# Patient Record
Sex: Male | Born: 1935 | Race: White | Hispanic: No | Marital: Married | Smoking: Former smoker
Health system: Southern US, Community
[De-identification: ages and names within clinical notes are randomized; demographics above are authoritative.]

## PROBLEM LIST (undated history)

## (undated) DIAGNOSIS — Z8673 Personal history of transient ischemic attack (TIA), and cerebral infarction without residual deficits: Secondary | ICD-10-CM

## (undated) DIAGNOSIS — E079 Disorder of thyroid, unspecified: Secondary | ICD-10-CM

## (undated) DIAGNOSIS — I10 Essential (primary) hypertension: Secondary | ICD-10-CM

## (undated) DIAGNOSIS — C44301 Unspecified malignant neoplasm of skin of nose: Secondary | ICD-10-CM

## (undated) DIAGNOSIS — K219 Gastro-esophageal reflux disease without esophagitis: Secondary | ICD-10-CM

## (undated) DIAGNOSIS — F32A Depression, unspecified: Secondary | ICD-10-CM

## (undated) DIAGNOSIS — I639 Cerebral infarction, unspecified: Secondary | ICD-10-CM

## (undated) DIAGNOSIS — K449 Diaphragmatic hernia without obstruction or gangrene: Secondary | ICD-10-CM

## (undated) DIAGNOSIS — N400 Enlarged prostate without lower urinary tract symptoms: Secondary | ICD-10-CM

## (undated) DIAGNOSIS — E785 Hyperlipidemia, unspecified: Secondary | ICD-10-CM

## (undated) DIAGNOSIS — F419 Anxiety disorder, unspecified: Secondary | ICD-10-CM

## (undated) DIAGNOSIS — F329 Major depressive disorder, single episode, unspecified: Secondary | ICD-10-CM

## (undated) DIAGNOSIS — Z9289 Personal history of other medical treatment: Secondary | ICD-10-CM

## (undated) HISTORY — DX: Disorder of thyroid, unspecified: E07.9

## (undated) HISTORY — DX: Essential (primary) hypertension: I10

## (undated) HISTORY — DX: Diaphragmatic hernia without obstruction or gangrene: K44.9

## (undated) HISTORY — DX: Benign prostatic hyperplasia without lower urinary tract symptoms: N40.0

## (undated) HISTORY — DX: Gastro-esophageal reflux disease without esophagitis: K21.9

## (undated) HISTORY — DX: Cerebral infarction, unspecified: I63.9

## (undated) HISTORY — DX: Depression, unspecified: F32.A

## (undated) HISTORY — DX: Major depressive disorder, single episode, unspecified: F32.9

## (undated) HISTORY — DX: Hyperlipidemia, unspecified: E78.5

## (undated) HISTORY — DX: Anxiety disorder, unspecified: F41.9

## (undated) HISTORY — PX: NO PAST SURGERIES: SHX2092

## (undated) HISTORY — PX: COLONOSCOPY: SHX174

## (undated) HISTORY — PX: NOSE SURGERY: SHX723

---

## 1998-12-24 DIAGNOSIS — Z9289 Personal history of other medical treatment: Secondary | ICD-10-CM

## 1998-12-24 HISTORY — DX: Personal history of other medical treatment: Z92.89

## 2002-12-30 ENCOUNTER — Ambulatory Visit (HOSPITAL_COMMUNITY): Admission: RE | Admit: 2002-12-30 | Discharge: 2002-12-30 | Payer: Self-pay | Admitting: *Deleted

## 2003-05-12 ENCOUNTER — Ambulatory Visit (HOSPITAL_COMMUNITY): Admission: RE | Admit: 2003-05-12 | Discharge: 2003-05-12 | Payer: Self-pay | Admitting: *Deleted

## 2003-06-10 ENCOUNTER — Ambulatory Visit (HOSPITAL_COMMUNITY): Admission: RE | Admit: 2003-06-10 | Discharge: 2003-06-10 | Payer: Self-pay | Admitting: *Deleted

## 2003-12-25 DIAGNOSIS — Z8673 Personal history of transient ischemic attack (TIA), and cerebral infarction without residual deficits: Secondary | ICD-10-CM

## 2003-12-25 HISTORY — DX: Personal history of transient ischemic attack (TIA), and cerebral infarction without residual deficits: Z86.73

## 2006-02-15 ENCOUNTER — Emergency Department (HOSPITAL_COMMUNITY): Admission: EM | Admit: 2006-02-15 | Discharge: 2006-02-16 | Payer: Self-pay | Admitting: Emergency Medicine

## 2007-03-06 IMAGING — CR DG CHEST 2V
2 series · 2 of 2 positions shown · non-contrast
Comparison: none

CLINICAL DATA: Syncope.  Vomiting.
 KSSYT-S VIEWS:
 Two views of the chest show the lungs to be clear.  The heart is within normal limits and size.  No bony abnormality is seen.

[w chest pa]
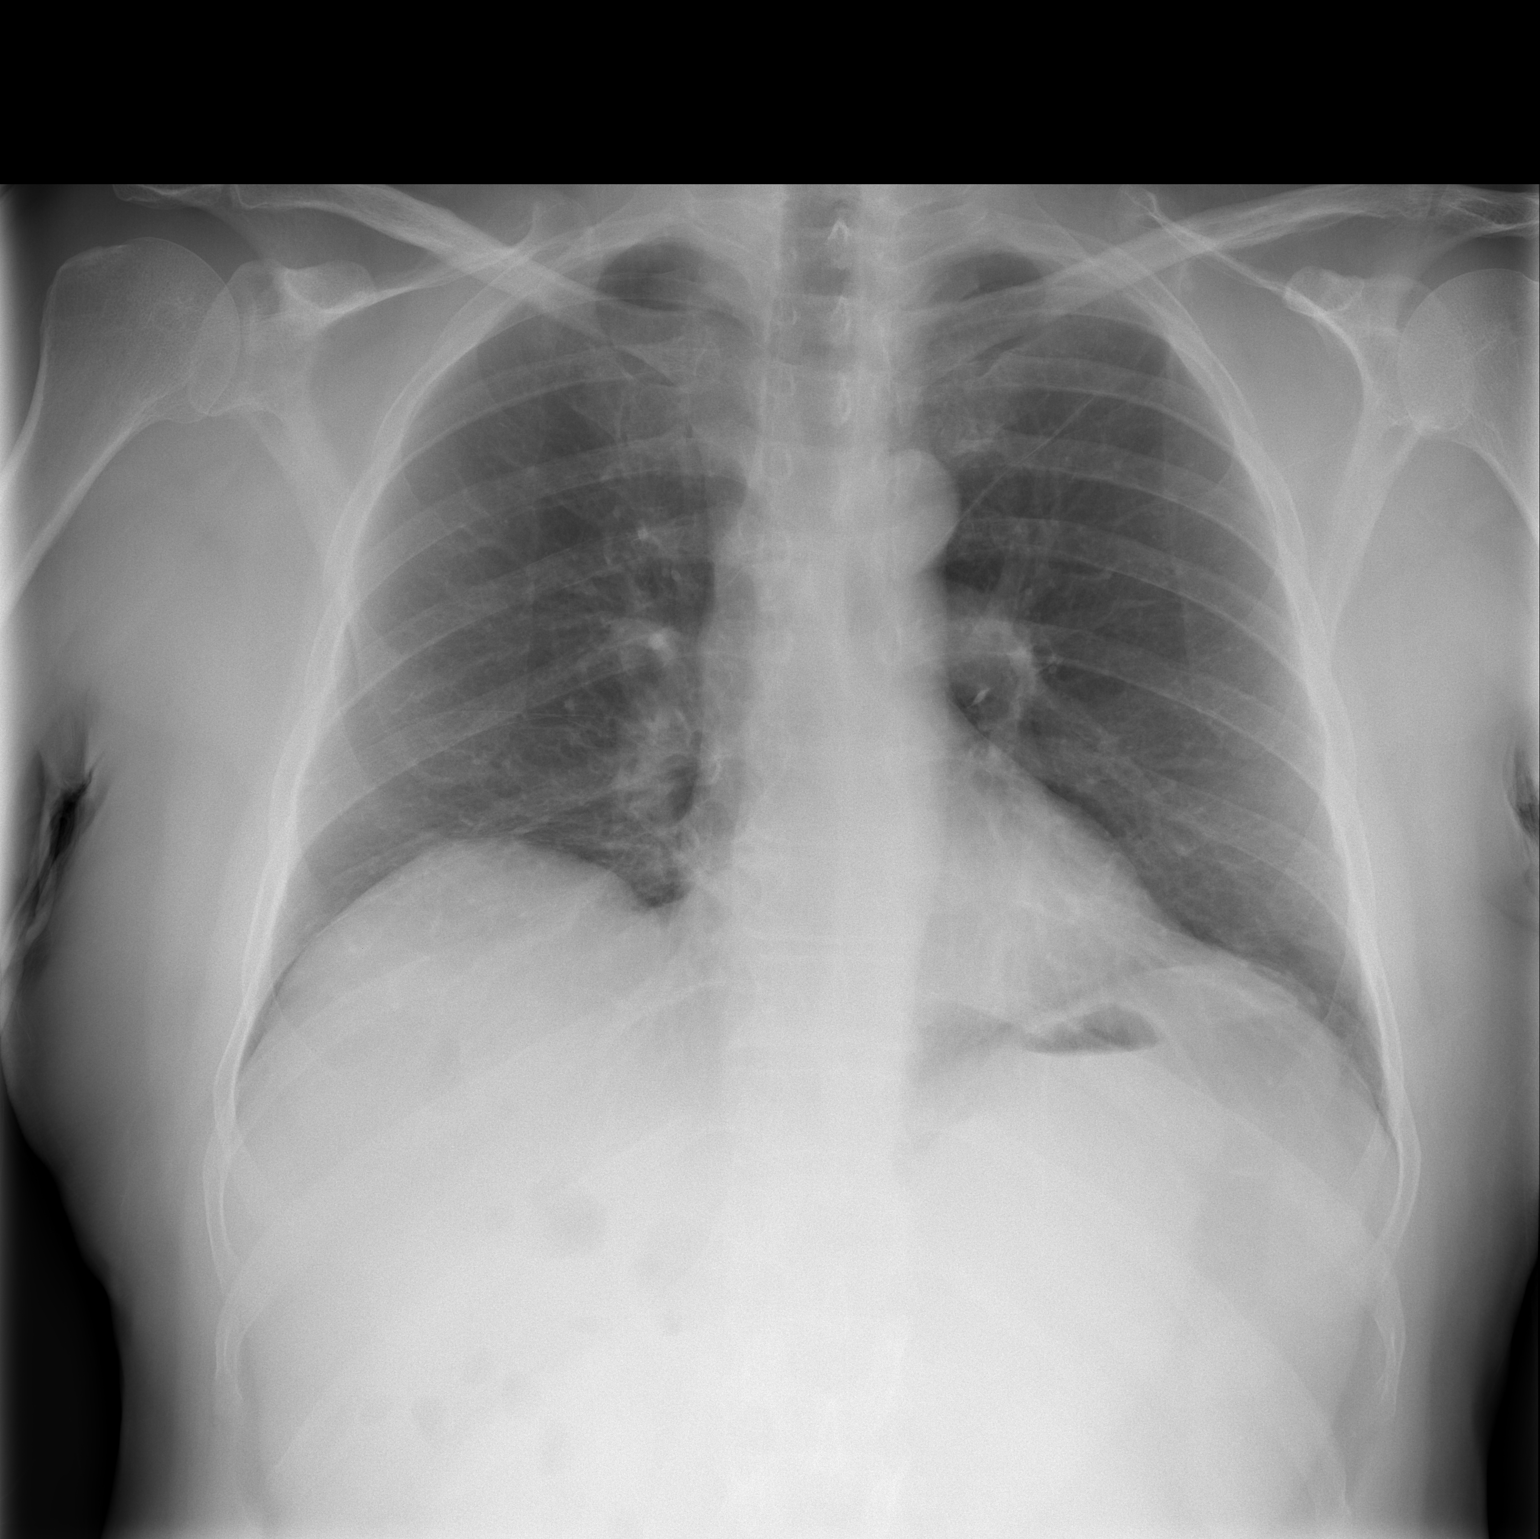

[w chest lat]
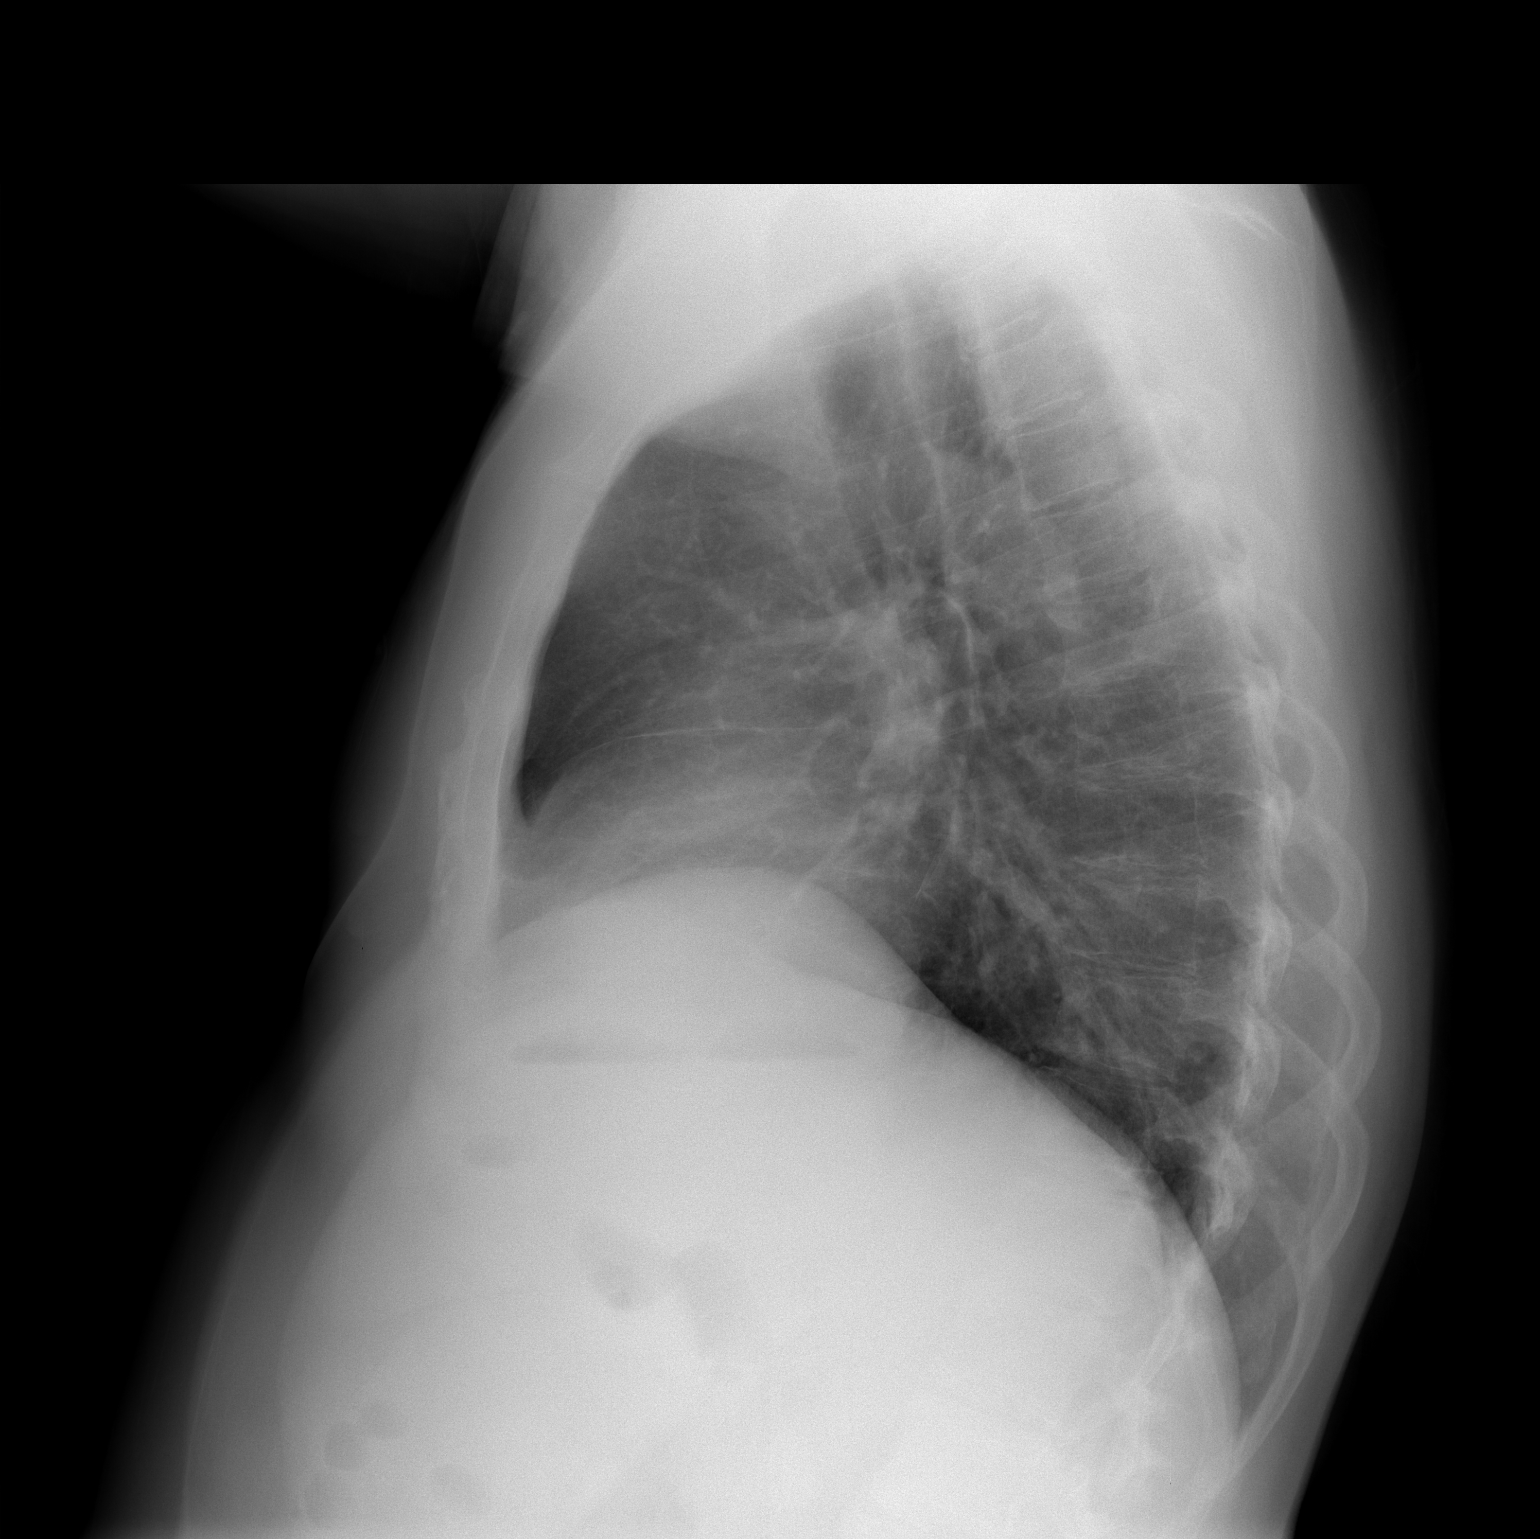

[2 of 2 positions shown; findings below may reference images not displayed]

IMPRESSION: No active lung disease.

## 2011-01-09 ENCOUNTER — Ambulatory Visit (HOSPITAL_COMMUNITY): Admission: RE | Admit: 2011-01-09 | Payer: Self-pay | Source: Home / Self Care | Admitting: General Surgery

## 2014-03-01 ENCOUNTER — Ambulatory Visit (INDEPENDENT_AMBULATORY_CARE_PROVIDER_SITE_OTHER): Payer: Medicare Other | Admitting: General Surgery

## 2014-03-01 ENCOUNTER — Encounter (INDEPENDENT_AMBULATORY_CARE_PROVIDER_SITE_OTHER): Payer: Self-pay | Admitting: General Surgery

## 2014-03-01 VITALS — BP 122/78 | HR 64 | Temp 97.7°F | Resp 18 | Ht 66.0 in | Wt 177.6 lb

## 2014-03-01 DIAGNOSIS — K4090 Unilateral inguinal hernia, without obstruction or gangrene, not specified as recurrent: Secondary | ICD-10-CM | POA: Insufficient documentation

## 2014-03-01 NOTE — Progress Notes (Signed)
Patient ID: Andrew Burch, male   DOB: [birth year], 80 y.o.   MRN: [id number]  Chief Complaint  Patient presents with  . Hernia    HPI Andrew Burch is a 80 y.o. male.  He returns to see me for reevaluation and surgical management of his left inguinal hernia.  I evaluated this gentleman in September of 2011. He had a left inguinal hernia but really wasn't hurting him. He plays golf 4 times a week. He wanted to go ahead and have this repaired but then decided against it. The hernia has gotten much bigger but now it hurts him more. His health is otherwise been stable.  Comorbidities include past history of TIA, hypertension, hyperlipidemia, GERD.  He takes Plavix and aspirin daily. antihypertensives.  He is allergic to penicillin which caused him to have a syncopal episode as a child.  HPI  Past Medical History  Diagnosis Date  . GERD (gastroesophageal reflux disease)   . Hyperlipidemia   . Hypertension   . Stroke   . Thyroid disease     History reviewed. No pertinent past surgical history.  History reviewed. No pertinent family history.  Social History History  Substance Use Topics  . Smoking status: Former Research scientist (life sciences)  . Smokeless tobacco: Not on file  . Alcohol Use: No    Allergies  Allergen Reactions  . Penicillins     Current Outpatient Prescriptions  Medication Sig Dispense Refill  . beta carotene w/minerals (OCUVITE) tablet Take 1 tablet by mouth daily.      . clopidogrel (PLAVIX) 75 MG tablet       . fenofibrate 160 MG tablet       . levothyroxine (SYNTHROID, LEVOTHROID) 88 MCG tablet       . Omega-3 Fatty Acids (FISH OIL) 1000 MG CAPS Take by mouth.      Marland Kitchen PARoxetine (PAXIL) 20 MG tablet       . quinapril (ACCUPRIL) 20 MG tablet       . rosuvastatin (CRESTOR) 20 MG tablet Take 20 mg by mouth daily.       No current facility-administered medications for this visit.    Review of Systems Review of Systems  Constitutional: Negative for fever, chills and unexpected  weight change.  HENT: Negative for congestion, hearing loss, sore throat, trouble swallowing and voice change.   Eyes: Negative for visual disturbance.  Respiratory: Negative for cough and wheezing.   Cardiovascular: Negative for chest pain, palpitations and leg swelling.  Gastrointestinal: Negative for nausea, vomiting, abdominal pain, diarrhea, constipation, blood in stool, abdominal distention, anal bleeding and rectal pain.  Genitourinary: Negative for hematuria and difficulty urinating.  Musculoskeletal: Negative for arthralgias.  Skin: Negative for rash and wound.  Neurological: Negative for seizures, syncope, weakness and headaches.  Hematological: Negative for adenopathy. Does not bruise/bleed easily.  Psychiatric/Behavioral: Negative for confusion.    Blood pressure 122/78, pulse 64, temperature 97.7 F (36.5 C), temperature source Temporal, resp. rate 18, height 5\' 6"  (1.676 m), weight 177 lb 9.6 oz (80.559 kg).  Physical Exam Physical Exam  Constitutional: He is oriented to person, place, and time. He appears well-developed and well-nourished. No distress.  Very pleasant   HENT:  Head: Normocephalic.  Nose: Nose normal.  Mouth/Throat: No oropharyngeal exudate.  Eyes: Conjunctivae and EOM are normal. Pupils are equal, round, and reactive to light. Right eye exhibits no discharge. Left eye exhibits no discharge. No scleral icterus.  Neck: Normal range of motion. Neck supple. No JVD present. No tracheal deviation present.  No thyromegaly present.  Cardiovascular: Normal rate, regular rhythm, normal heart sounds and intact distal pulses.   No murmur heard. Pulmonary/Chest: Effort normal and breath sounds normal. No stridor. No respiratory distress. He has no wheezes. He has no rales. He exhibits no tenderness.  Abdominal: Soft. Bowel sounds are normal. He exhibits no distension and no mass. There is no tenderness. There is no rebound and no guarding.  Genitourinary:  Small to  medium size left inguinal hernia. Reducible. No hernia on the right. No scrotal or testicular mass.right testicle rides higher than left.  Musculoskeletal: Normal range of motion. He exhibits no edema and no tenderness.  Lymphadenopathy:    He has no cervical adenopathy.  Neurological: He is alert and oriented to person, place, and time. He has normal reflexes. Coordination normal.  Skin: Skin is warm and dry. No rash noted. He is not diaphoretic. No erythema. No pallor.  Psychiatric: He has a normal mood and affect. His behavior is normal. Judgment and thought content normal.    Data Reviewed My old records  Assessment    Left inguinal hernia, becoming symptomatic and painful  History of transient ischemic attack, on ASA and Plavix  Hypertension  Hyperlipidemia  Hypothyroidism     Plan    We had long conversation about the anatomy and natural history of his inguinal hernia. We talked about open repair and laparoscopic repair, and the pros and cons of each.  We will proceed with scheduling for laparoscopic repair of left inguinal hernia with mesh, possible open.  Discontinue Plavix 5 days preop and aspirin 3 days preop, if approved by his PCP, [name].  I discussed the indications, details techniques, and numerous risk of the surgery with him. He is aware of the risk of bleeding, infection, recurrence, nerve damage, chronic pain, injury to adjacent organs as the testicle or bladder, cardiac, pulmonary, and thromboembolic problems. He is aware of the risk of conversion to open surgery. He understands all these issues. All his questions are answered. He agrees with this plan.        Edsel Petrin. Dalbert Batman, M.D., [facility] General and Minimally invasive Surgery Breast and Colorectal Surgery Office:   [phone] Pager:   [phone]  03/01/2014, 4:22 PM

## 2014-03-01 NOTE — Patient Instructions (Signed)
You will be scheduled for a laparoscopic repair of your left inguinal hernia with mesh, possible open repair.  I would like you to stop your Plavix 5 days preop and stop your aspirin 3 days preop. We will ask Dr. Serina Cowper if he improves of this, just like we did last time.      Inguinal Hernia, Adult Muscles help keep everything in the body in its proper place. But if a weak spot in the muscles develops, something can poke through. That is called a hernia. When this happens in the lower part of the belly (abdomen), it is called an inguinal hernia. (It takes its name from a part of the body in this region called the inguinal canal.) A weak spot in the wall of muscles lets some fat or part of the small intestine bulge through. An inguinal hernia can develop at any age. Men get them more often than women. CAUSES  In adults, an inguinal hernia develops over time.  It can be triggered by:  Suddenly straining the muscles of the lower abdomen.  Lifting heavy objects.  Straining to have a bowel movement. Difficult bowel movements (constipation) can lead to this.  Constant coughing. This may be caused by smoking or lung disease.  Being overweight.  Being pregnant.  Working at a job that requires long periods of standing or heavy lifting.  Having had an inguinal hernia before. One type can be an emergency situation. It is called a strangulated inguinal hernia. It develops if part of the small intestine slips through the weak spot and cannot get back into the abdomen. The blood supply can be cut off. If that happens, part of the intestine may die. This situation requires emergency surgery. SYMPTOMS  Often, a small inguinal hernia has no symptoms. It is found when a healthcare provider does a physical exam. Larger hernias usually have symptoms.   In adults, symptoms may include:  A lump in the groin. This is easier to see when the person is standing. It might disappear when lying  down.  In men, a lump in the scrotum.  Pain or burning in the groin. This occurs especially when lifting, straining or coughing.  A dull ache or feeling of pressure in the groin.  Signs of a strangulated hernia can include:  A bulge in the groin that becomes very painful and tender to the touch.  A bulge that turns red or purple.  Fever, nausea and vomiting.  Inability to have a bowel movement or to pass gas. DIAGNOSIS  To decide if you have an inguinal hernia, a healthcare provider will probably do a physical examination.  This will include asking questions about any symptoms you have noticed.  The healthcare provider might feel the groin area and ask you to cough. If an inguinal hernia is felt, the healthcare provider may try to slide it back into the abdomen.  Usually no other tests are needed. TREATMENT  Treatments can vary. The size of the hernia makes a difference. Options include:  Watchful waiting. This is often suggested if the hernia is small and you have had no symptoms.  No medical procedure will be done unless symptoms develop.  You will need to watch closely for symptoms. If any occur, contact your healthcare provider right away.  Surgery. This is used if the hernia is larger or you have symptoms.  Open surgery. This is usually an outpatient procedure (you will not stay overnight in a hospital). An cut (incision)  is made through the skin in the groin. The hernia is put back inside the abdomen. The weak area in the muscles is then repaired by herniorrhaphy or hernioplasty. Herniorrhaphy: in this type of surgery, the weak muscles are sewn back together. Hernioplasty: a patch or mesh is used to close the weak area in the abdominal wall.  Laparoscopy. In this procedure, a surgeon makes small incisions. A thin tube with a tiny video camera (called a laparoscope) is put into the abdomen. The surgeon repairs the hernia with mesh by looking with the video camera and using  two long instruments. HOME CARE INSTRUCTIONS   After surgery to repair an inguinal hernia:  You will need to take pain medicine prescribed by your healthcare provider. Follow all directions carefully.  You will need to take care of the wound from the incision.  Your activity will be restricted for awhile. This will probably include no heavy lifting for several weeks. You also should not do anything too active for a few weeks. When you can return to work will depend on the type of job that you have.  During "watchful waiting" periods, you should:  Maintain a healthy weight.  Eat a diet high in fiber (fruits, vegetables and whole grains).  Drink plenty of fluids to avoid constipation. This means drinking enough water and other liquids to keep your urine clear or pale yellow.  Do not lift heavy objects.  Do not stand for long periods of time.  Quit smoking. This should keep you from developing a frequent cough. SEEK MEDICAL CARE IF:   A bulge develops in your groin area.  You feel pain, a burning sensation or pressure in the groin. This might be worse if you are lifting or straining.  You develop a fever of more than 100.5 F (38.1 C). SEEK IMMEDIATE MEDICAL CARE IF:   Pain in the groin increases suddenly.  A bulge in the groin gets bigger suddenly and does not go down.  For men, there is sudden pain in the scrotum. Or, the size of the scrotum increases.  A bulge in the groin area becomes red or purple and is painful to touch.  You have nausea or vomiting that does not go away.  You feel your heart beating much faster than normal.  You cannot have a bowel movement or pass gas.  You develop a fever of more than 102.0 F (38.9 C). Document Released: 04/28/2009 Document Revised: 03/03/2012 Document Reviewed: 04/28/2009 [facility] Patient Information 2014 [place], [place].

## 2014-03-22 ENCOUNTER — Telehealth (INDEPENDENT_AMBULATORY_CARE_PROVIDER_SITE_OTHER): Payer: Self-pay

## 2014-03-22 ENCOUNTER — Telehealth (INDEPENDENT_AMBULATORY_CARE_PROVIDER_SITE_OTHER): Payer: Self-pay | Admitting: General Surgery

## 2014-03-22 ENCOUNTER — Encounter (INDEPENDENT_AMBULATORY_CARE_PROVIDER_SITE_OTHER): Payer: Self-pay

## 2014-03-22 NOTE — Telephone Encounter (Signed)
I have refaxed clearance request from 03-01-14 to Dr C. Melinda Crutch and also called his office to determine delay in getting clearance and ability to stop plavix -aspirin 5 days before surgery.

## 2014-03-25 ENCOUNTER — Encounter (HOSPITAL_COMMUNITY): Payer: Self-pay

## 2014-03-25 ENCOUNTER — Encounter (HOSPITAL_COMMUNITY)
Admission: RE | Admit: 2014-03-25 | Discharge: 2014-03-25 | Disposition: A | Discharge status: Home / Self Care | Payer: Medicare Other | Source: Ambulatory Visit | Attending: Anesthesiology | Admitting: Anesthesiology

## 2014-03-25 ENCOUNTER — Encounter (HOSPITAL_COMMUNITY)
Admission: RE | Admit: 2014-03-25 | Discharge: 2014-03-25 | Disposition: A | Discharge status: Home / Self Care | Payer: Medicare Other | Source: Ambulatory Visit | Attending: General Surgery | Admitting: General Surgery

## 2014-03-25 DIAGNOSIS — Z01818 Encounter for other preprocedural examination: Secondary | ICD-10-CM | POA: Insufficient documentation

## 2014-03-25 DIAGNOSIS — Z01810 Encounter for preprocedural cardiovascular examination: Secondary | ICD-10-CM | POA: Insufficient documentation

## 2014-03-25 DIAGNOSIS — Z01812 Encounter for preprocedural laboratory examination: Secondary | ICD-10-CM | POA: Insufficient documentation

## 2014-03-25 HISTORY — DX: Unspecified malignant neoplasm of skin of nose: C44.301

## 2014-03-25 HISTORY — DX: Personal history of transient ischemic attack (TIA), and cerebral infarction without residual deficits: Z86.73

## 2014-03-25 HISTORY — DX: Personal history of other medical treatment: Z92.89

## 2014-03-25 LAB — CBC WITH DIFFERENTIAL/PLATELET
Basophils Absolute: 0 10*3/uL (ref 0.0–0.1)
Basophils Relative: 0 % (ref 0–1)
Eosinophils Absolute: 0.2 10*3/uL (ref 0.0–0.7)
Eosinophils Relative: 4 % (ref 0–5)
HEMATOCRIT: 39.6 % (ref 39.0–52.0)
Hemoglobin: 13.8 g/dL (ref 13.0–17.0)
Lymphocytes Relative: 33 % (ref 12–46)
Lymphs Abs: 1.6 10*3/uL (ref 0.7–4.0)
MCH: 32.8 pg (ref 26.0–34.0)
MCHC: 34.8 g/dL (ref 30.0–36.0)
MCV: 94.1 fL (ref 78.0–100.0)
Monocytes Absolute: 0.5 10*3/uL (ref 0.1–1.0)
Monocytes Relative: 10 % (ref 3–12)
NEUTROS PCT: 53 % (ref 43–77)
Neutro Abs: 2.6 10*3/uL (ref 1.7–7.7)
PLATELETS: 156 10*3/uL (ref 150–400)
RBC: 4.21 MIL/uL — AB (ref 4.22–5.81)
RDW: 12.7 % (ref 11.5–15.5)
WBC: 4.9 10*3/uL (ref 4.0–10.5)

## 2014-03-25 LAB — URINE MICROSCOPIC-ADD ON

## 2014-03-25 LAB — COMPREHENSIVE METABOLIC PANEL
ALBUMIN: 3.9 g/dL (ref 3.5–5.2)
ALK PHOS: 56 U/L (ref 39–117)
ALT: 22 U/L (ref 0–53)
AST: 35 U/L (ref 0–37)
BILIRUBIN TOTAL: 0.4 mg/dL (ref 0.3–1.2)
BUN: 19 mg/dL (ref 6–23)
CALCIUM: 9.4 mg/dL (ref 8.4–10.5)
CHLORIDE: 100 meq/L (ref 96–112)
CO2: 24 meq/L (ref 19–32)
CREATININE: 1.06 mg/dL (ref 0.50–1.35)
GFR calc non Af Amer: 65 mL/min — ABNORMAL LOW (ref >90)
GFR, EST AFRICAN AMERICAN: 76 mL/min — AB (ref >90)
GLUCOSE: 136 mg/dL — AB (ref 70–99)
POTASSIUM: 4.4 meq/L (ref 3.7–5.3)
SODIUM: 138 meq/L (ref 137–147)
TOTAL PROTEIN: 7.1 g/dL (ref 6.0–8.3)

## 2014-03-25 LAB — URINALYSIS, ROUTINE W REFLEX MICROSCOPIC
Bilirubin Urine: NEGATIVE
Glucose, UA: NEGATIVE mg/dL
Ketones, ur: NEGATIVE mg/dL
Leukocytes, UA: NEGATIVE
Nitrite: NEGATIVE
PH: 5.5 (ref 5.0–8.0)
PROTEIN: NEGATIVE mg/dL
Specific Gravity, Urine: 1.022 (ref 1.005–1.030)
Urobilinogen, UA: 0.2 mg/dL (ref 0.0–1.0)

## 2014-03-25 NOTE — Pre-Procedure Instructions (Signed)
[place]  03/25/2014   Your procedure is scheduled on:  Monday, April 6.  Report to [facility], Main Entrance [place] "A" at 5:30AM.  Call this number if you have problems the morning of surgery: [phone]   Remember:   Do not eat food or drink liquids after midnight Sunday, April 5.   Take these medicines the morning of surgery with A SIP OF WATER: levothyroxine (SYNTHROID, LEVOTHROID), PARoxetine (PAXIL).      Do not wear jewelry, make-up or nail polish.  Do not wear lotions, powders, or perfumes.   Do not shave 48 hours prior to surgery. Men may shave face and neck.  Do not bring valuables to the hospital.  [facility] is not responsible for any belongings or valuables.               Contacts, dentures or bridgework may not be worn into surgery.  Leave suitcase in the car. After surgery it may be brought to your room.  For patients admitted to the hospital, discharge time is determined by your treatment team.               Patients discharged the day of surgery will not be allowed to drive home.  Name and phone number of your driver: -   Special Instructions: Review  [place] - Preparing For Surgery.   Please read over the following fact sheets that you were given: Pain Booklet, Coughing and Deep Breathing and Surgical Site Infection Prevention

## 2014-03-25 NOTE — Progress Notes (Signed)
STOP-Bang score 4;results sent to PCP Dr Harrington Challenger at [place] @ [facility]

## 2014-03-25 NOTE — Pre-Procedure Instructions (Signed)
[place]  03/25/2014   Your procedure is scheduled on:  Monday, April 6 at 7:30  Report to [place] Entrance "A" at 5;30 AM.  Call this number if you have problems the morning of surgery: [phone]   Remember:   Do not eat food or drink liquids after midnight.Sunday night   Take these medicines the morning of surgery with A SIP OF WATER: Levothyroxine,Paroxetine   Do not wear jewelry, make-up or nail polish.  Do not wear lotions, powders, or perfumes. You may wear deodorant.  Do not shave 48 hours prior to surgery. Men may shave face and neck.  Do not bring valuables to the hospital.  [facility] is not responsible                  for any belongings or valuables.               Contacts, dentures or bridgework may not be worn into surgery.  Leave suitcase in the car. After surgery it may be brought to your room.  For patients admitted to the hospital, discharge time is determined by your      treatment team.                 Special Instructions: [place] - Preparing for Surgery  Before surgery, you can play an important role.  Because skin is not sterile, your skin needs to be as free of germs as possible.  You can reduce the number of germs on you skin by washing with CHG (chlorahexidine gluconate) soap before surgery.  CHG is an antiseptic cleaner which kills germs and bonds with the skin to continue killing germs even after washing.  Please DO NOT use if you have an allergy to CHG or antibacterial soaps.  If your skin becomes reddened/irritated stop using the CHG and inform your nurse when you arrive at Short Stay.  Do not shave (including legs and underarms) for at least 48 hours prior to the first CHG shower.  You may shave your face.  Please follow these instructions carefully:   1.  Shower with CHG Soap the night before surgery and the   morning of Surgery.  2.  If you choose to wash your hair, wash your hair first as usual with your   normal  shampoo.  3.  After you shampoo, rinse your hair and body thoroughly to remove the  Shampoo.  4.  Use CHG as you would any other liquid soap.  You can apply chg directly   to the skin and wash gently with scrungie or a clean washcloth.  5.  Apply the CHG Soap to your body ONLY FROM THE NECK DOWN.   Do not use on open wounds or open sores.  Avoid contact with your eyes , ears, mouth and genitals (private parts).  Wash genitals (private parts)  with your normal soap.  6.  Wash thoroughly, paying special attention to the area where your surgery   will be performed.  7.  Thoroughly rinse your body with warm water from the neck down.  8.  DO NOT shower/wash with your normal soap after using and rinsing off   the CHG Soap.  9.  Pat yourself dry with a clean towel.            10.  Wear clean pajamas.            11 .  Place clean sheets on your bed  the night of your first shower and do not   sleep with pets.  Day of Surgery  Do not apply any lotions/deoderants the morning of surgery.  Please wear clean clothes to the hospital/surgery center.      Please read over the following fact sheets that you were given: Pain Booklet, Coughing and Deep Breathing and Surgical Site Infection Prevention

## 2014-03-26 NOTE — H&P (Signed)
Andrew Burch   MRN:  [id number]   Description: 80 year old male  Provider: Adin Hector, MD  Department: [facility]               Diagnoses      Left inguinal hernia    -  Primary      550.90              Current Vitals - Last Recorded      BP Pulse Temp(Src) Resp Ht Wt      122/78 64 97.7 F (36.5 C) (Temporal) 18 5\' 6"  (1.676 m) 177 lb 9.6 oz (80.559 kg)      BMI 28.68 kg/m2                      History and Physical     Adin Hector, MD    Status: Signed            Patient ID: Andrew Burch, male   DOB: Jan 24, 1933, 80 y.o.   MRN: [id number]             HPI Andrew Burch is a 80 y.o. male.  He returns to see me for reevaluation and surgical management of his left inguinal hernia.   I evaluated this gentleman in September of 2011. He had a left inguinal hernia but really wasn't hurting him. He plays golf 4 times a week. He wanted to go ahead and have this repaired but then decided against it. The hernia has gotten much bigger but now it hurts him more. His health is otherwise been stable.   Comorbidities include past history of TIA, hypertension, hyperlipidemia, GERD.   He takes Plavix and aspirin daily. antihypertensives.   He is allergic to penicillin which caused him to have a syncopal episode as a child.         Past Medical History   Diagnosis  Date   .  GERD (gastroesophageal reflux disease)     .  Hyperlipidemia     .  Hypertension     .  Stroke     .  Thyroid disease          History reviewed. No pertinent past surgical history.   History reviewed. No pertinent family history.   Social History History   Substance Use Topics   .  Smoking status:  Former Research scientist (life sciences)   .  Smokeless tobacco:  Not on file   .  Alcohol Use:  No         Allergies   Allergen  Reactions   .  Penicillins           Current Outpatient Prescriptions   Medication  Sig  Dispense  Refill   .  beta carotene w/minerals (OCUVITE) tablet  Take  1 tablet by mouth daily.         .  clopidogrel (PLAVIX) 75 MG tablet           .  fenofibrate 160 MG tablet           .  levothyroxine (SYNTHROID, LEVOTHROID) 88 MCG tablet           .  Omega-3 Fatty Acids (FISH OIL) 1000 MG CAPS  Take by mouth.         Marland Kitchen  PARoxetine (PAXIL) 20 MG tablet           .  quinapril (ACCUPRIL) 20 MG tablet           .  rosuvastatin (CRESTOR) 20 MG tablet  Take 20 mg by mouth daily.                   Review of Systems  Constitutional: Negative for fever, chills and unexpected weight change.  HENT: Negative for congestion, hearing loss, sore throat, trouble swallowing and voice change.   Eyes: Negative for visual disturbance.  Respiratory: Negative for cough and wheezing.   Cardiovascular: Negative for chest pain, palpitations and leg swelling.  Gastrointestinal: Negative for nausea, vomiting, abdominal pain, diarrhea, constipation, blood in stool, abdominal distention, anal bleeding and rectal pain.  Genitourinary: Negative for hematuria and difficulty urinating.  Musculoskeletal: Negative for arthralgias.  Skin: Negative for rash and wound.  Neurological: Negative for seizures, syncope, weakness and headaches.  Hematological: Negative for adenopathy. Does not bruise/bleed easily.  Psychiatric/Behavioral: Negative for confusion.      Blood pressure 122/78, pulse 64, temperature 97.7 F (36.5 C), temperature source Temporal, resp. rate 18, height 5\' 6"  (1.676 m), weight 177 lb 9.6 oz (80.559 kg).   Physical Exam   Constitutional: He is oriented to person, place, and time. He appears well-developed and well-nourished. No distress.  Very pleasant   HENT:   Head: Normocephalic.   Nose: Nose normal.   Mouth/Throat: No oropharyngeal exudate.  Eyes: Conjunctivae and EOM are normal. Pupils are equal, round, and reactive to light. Right eye exhibits no discharge. Left eye exhibits no discharge. No scleral icterus.  Neck: Normal range of motion. Neck  supple. No JVD present. No tracheal deviation present. No thyromegaly present.  Cardiovascular: Normal rate, regular rhythm, normal heart sounds and intact distal pulses.    No murmur heard. Pulmonary/Chest: Effort normal and breath sounds normal. No stridor. No respiratory distress. He has no wheezes. He has no rales. He exhibits no tenderness.  Abdominal: Soft. Bowel sounds are normal. He exhibits no distension and no mass. There is no tenderness. There is no rebound and no guarding.  Genitourinary:  Small to medium size left inguinal hernia. Reducible. No hernia on the right. No scrotal or testicular mass.right testicle rides higher than left.  Musculoskeletal: Normal range of motion. He exhibits no edema and no tenderness.  Lymphadenopathy:    He has no cervical adenopathy.  Neurological: He is alert and oriented to person, place, and time. He has normal reflexes. Coordination normal.  Skin: Skin is warm and dry. No rash noted. He is not diaphoretic. No erythema. No pallor.  Psychiatric: He has a normal mood and affect. His behavior is normal. Judgment and thought content normal.       Assessment    Left inguinal hernia, becoming symptomatic and painful   History of transient ischemic attack, on ASA and Plavix   Hypertension   Hyperlipidemia   Hypothyroidism      Plan    We had long conversation about the anatomy and natural history of his inguinal hernia. We talked about open repair and laparoscopic repair, and the pros and cons of each.   We will proceed with scheduling for laparoscopic repair of left inguinal hernia with mesh, possible open.   Discontinue Plavix 5 days preop and aspirin 3 days preop, if approved by his PCP, [name].   I discussed the indications, details techniques, and numerous risk of the surgery with him. He is aware of the risk of bleeding, infection, recurrence, nerve damage, chronic pain, injury to adjacent organs as the testicle  or bladder, cardiac, pulmonary, and thromboembolic problems. He  is aware of the risk of conversion to open surgery. He understands all these issues. All his questions are answered. He agrees with this plan.           Edsel Petrin. Dalbert Batman, M.D., [facility] General and Minimally invasive Surgery Breast and Colorectal Surgery Office:   [phone] Pager:   [phone]

## 2014-03-28 MED ORDER — VANCOMYCIN HCL IN DEXTROSE 1-5 GM/200ML-% IV SOLN
1000.0000 mg | INTRAVENOUS | Status: AC
  Administered 2014-03-29: 1000 mg via INTRAVENOUS
  Filled 2014-03-28: qty 200

## 2014-03-28 MED ORDER — CHLORHEXIDINE GLUCONATE 4 % EX LIQD
1.0000 "application " | Freq: Once | CUTANEOUS | Status: DC
  Filled 2014-03-28: qty 15

## 2014-03-29 ENCOUNTER — Ambulatory Visit (HOSPITAL_COMMUNITY): Payer: Medicare Other | Admitting: Anesthesiology

## 2014-03-29 ENCOUNTER — Encounter (HOSPITAL_COMMUNITY): Payer: Self-pay | Admitting: *Deleted

## 2014-03-29 ENCOUNTER — Ambulatory Visit (HOSPITAL_COMMUNITY)
Admission: RE | Admit: 2014-03-29 | Discharge: 2014-03-29 | Disposition: A | Discharge status: Home / Self Care | Payer: Medicare Other | Source: Ambulatory Visit | Attending: General Surgery | Admitting: General Surgery

## 2014-03-29 ENCOUNTER — Encounter (HOSPITAL_COMMUNITY): Payer: Medicare Other | Admitting: Anesthesiology

## 2014-03-29 ENCOUNTER — Encounter (HOSPITAL_COMMUNITY)
Admission: RE | Disposition: A | Discharge status: Home / Self Care | Payer: Self-pay | Source: Ambulatory Visit | Attending: General Surgery

## 2014-03-29 DIAGNOSIS — K219 Gastro-esophageal reflux disease without esophagitis: Secondary | ICD-10-CM | POA: Insufficient documentation

## 2014-03-29 DIAGNOSIS — I10 Essential (primary) hypertension: Secondary | ICD-10-CM | POA: Insufficient documentation

## 2014-03-29 DIAGNOSIS — Z87891 Personal history of nicotine dependence: Secondary | ICD-10-CM | POA: Insufficient documentation

## 2014-03-29 DIAGNOSIS — E039 Hypothyroidism, unspecified: Secondary | ICD-10-CM | POA: Insufficient documentation

## 2014-03-29 DIAGNOSIS — K4090 Unilateral inguinal hernia, without obstruction or gangrene, not specified as recurrent: Secondary | ICD-10-CM | POA: Insufficient documentation

## 2014-03-29 DIAGNOSIS — Z8673 Personal history of transient ischemic attack (TIA), and cerebral infarction without residual deficits: Secondary | ICD-10-CM | POA: Insufficient documentation

## 2014-03-29 DIAGNOSIS — E785 Hyperlipidemia, unspecified: Secondary | ICD-10-CM | POA: Insufficient documentation

## 2014-03-29 HISTORY — PX: INSERTION OF MESH: SHX5868

## 2014-03-29 HISTORY — PX: INGUINAL HERNIA REPAIR: SHX194

## 2014-03-29 SURGERY — REPAIR, HERNIA, INGUINAL, LAPAROSCOPIC
Anesthesia: General | Site: Groin | Laterality: Left

## 2014-03-29 MED ORDER — ONDANSETRON HCL 4 MG/2ML IJ SOLN
INTRAMUSCULAR | Status: AC
Start: 2014-03-29 — End: 2014-03-29
  Filled 2014-03-29: qty 2

## 2014-03-29 MED ORDER — FENTANYL CITRATE 0.05 MG/ML IJ SOLN: 25.0000 ug | INTRAMUSCULAR | Status: DC | PRN

## 2014-03-29 MED ORDER — ROCURONIUM BROMIDE 50 MG/5ML IV SOLN
INTRAVENOUS | Status: AC
  Filled 2014-03-29: qty 1

## 2014-03-29 MED ORDER — PHENYLEPHRINE HCL 10 MG/ML IJ SOLN
INTRAMUSCULAR | Status: DC | PRN
  Administered 2014-03-29 (×4): 40 ug via INTRAVENOUS

## 2014-03-29 MED ORDER — PROPOFOL 10 MG/ML IV BOLUS
INTRAVENOUS | Status: DC | PRN
  Administered 2014-03-29: 150 mg via INTRAVENOUS

## 2014-03-29 MED ORDER — GLYCOPYRROLATE 0.2 MG/ML IJ SOLN
INTRAMUSCULAR | Status: DC | PRN
  Administered 2014-03-29: .6 mg via INTRAVENOUS

## 2014-03-29 MED ORDER — ROCURONIUM BROMIDE 100 MG/10ML IV SOLN
INTRAVENOUS | Status: DC | PRN
  Administered 2014-03-29: 40 mg via INTRAVENOUS

## 2014-03-29 MED ORDER — FENTANYL CITRATE 0.05 MG/ML IJ SOLN
INTRAMUSCULAR | Status: AC
  Filled 2014-03-29: qty 5

## 2014-03-29 MED ORDER — STERILE WATER FOR INJECTION IJ SOLN
INTRAMUSCULAR | Status: AC
  Filled 2014-03-29: qty 10

## 2014-03-29 MED ORDER — SODIUM CHLORIDE 0.9 % IR SOLN
Status: DC | PRN
  Administered 2014-03-29: 1000 mL

## 2014-03-29 MED ORDER — ACETAMINOPHEN 650 MG RE SUPP
650.0000 mg | RECTAL | Status: DC | PRN
  Filled 2014-03-29: qty 1

## 2014-03-29 MED ORDER — FENTANYL CITRATE 0.05 MG/ML IJ SOLN
INTRAMUSCULAR | Status: DC | PRN
  Administered 2014-03-29: 75 ug via INTRAVENOUS

## 2014-03-29 MED ORDER — LIDOCAINE HCL (CARDIAC) 20 MG/ML IV SOLN
INTRAVENOUS | Status: DC | PRN
  Administered 2014-03-29: 100 mg via INTRAVENOUS

## 2014-03-29 MED ORDER — LIDOCAINE HCL (CARDIAC) 20 MG/ML IV SOLN
INTRAVENOUS | Status: AC
  Filled 2014-03-29: qty 5

## 2014-03-29 MED ORDER — EPHEDRINE SULFATE 50 MG/ML IJ SOLN
INTRAMUSCULAR | Status: DC | PRN
  Administered 2014-03-29 (×3): 5 mg via INTRAVENOUS

## 2014-03-29 MED ORDER — NEOSTIGMINE METHYLSULFATE 1 MG/ML IJ SOLN
INTRAMUSCULAR | Status: AC
  Filled 2014-03-29: qty 10

## 2014-03-29 MED ORDER — 0.9 % SODIUM CHLORIDE (POUR BTL) OPTIME
TOPICAL | Status: DC | PRN
  Administered 2014-03-29: 1000 mL

## 2014-03-29 MED ORDER — BUPIVACAINE-EPINEPHRINE 0.25% -1:200000 IJ SOLN
INTRAMUSCULAR | Status: DC | PRN
  Administered 2014-03-29: 10 mL

## 2014-03-29 MED ORDER — ACETAMINOPHEN 325 MG PO TABS
650.0000 mg | ORAL_TABLET | ORAL | Status: DC | PRN
  Filled 2014-03-29: qty 2

## 2014-03-29 MED ORDER — PROPOFOL 10 MG/ML IV BOLUS
INTRAVENOUS | Status: AC
  Filled 2014-03-29: qty 20

## 2014-03-29 MED ORDER — EPHEDRINE SULFATE 50 MG/ML IJ SOLN
INTRAMUSCULAR | Status: AC
  Filled 2014-03-29: qty 1

## 2014-03-29 MED ORDER — ONDANSETRON HCL 4 MG/2ML IJ SOLN
INTRAMUSCULAR | Status: DC | PRN
  Administered 2014-03-29: 4 mg via INTRAVENOUS

## 2014-03-29 MED ORDER — SODIUM CHLORIDE 0.9 % IV SOLN: INTRAVENOUS | Status: DC

## 2014-03-29 MED ORDER — PROMETHAZINE HCL 25 MG/ML IJ SOLN: 6.2500 mg | INTRAMUSCULAR | Status: DC | PRN

## 2014-03-29 MED ORDER — LACTATED RINGERS IV SOLN
INTRAVENOUS | Status: DC | PRN
  Administered 2014-03-29 (×2): via INTRAVENOUS

## 2014-03-29 MED ORDER — BUPIVACAINE-EPINEPHRINE (PF) 0.25% -1:200000 IJ SOLN
INTRAMUSCULAR | Status: AC
  Filled 2014-03-29: qty 30

## 2014-03-29 MED ORDER — PHENYLEPHRINE 40 MCG/ML (10ML) SYRINGE FOR IV PUSH (FOR BLOOD PRESSURE SUPPORT)
PREFILLED_SYRINGE | INTRAVENOUS | Status: AC
  Filled 2014-03-29: qty 10

## 2014-03-29 MED ORDER — HYDROCODONE-ACETAMINOPHEN 5-325 MG PO TABS: 1.0000 | ORAL_TABLET | Freq: Four times a day (QID) | ORAL | Status: DC | PRN

## 2014-03-29 MED ORDER — GLYCOPYRROLATE 0.2 MG/ML IJ SOLN
INTRAMUSCULAR | Status: AC
  Filled 2014-03-29: qty 3

## 2014-03-29 MED ORDER — SUCCINYLCHOLINE CHLORIDE 20 MG/ML IJ SOLN
INTRAMUSCULAR | Status: AC
  Filled 2014-03-29: qty 1

## 2014-03-29 MED ORDER — OXYCODONE HCL 5 MG PO TABS: 5.0000 mg | ORAL_TABLET | ORAL | Status: DC | PRN

## 2014-03-29 MED ORDER — NEOSTIGMINE METHYLSULFATE 1 MG/ML IJ SOLN
INTRAMUSCULAR | Status: DC | PRN
  Administered 2014-03-29: 4 mg via INTRAVENOUS

## 2014-03-29 SURGICAL SUPPLY — 45 items
ADH SKN CLS APL DERMABOND .7 (GAUZE/BANDAGES/DRESSINGS) ×1
APPLIER CLIP LOGIC TI 5 (MISCELLANEOUS) IMPLANT
APR CLP MED LRG 33X5 (MISCELLANEOUS)
CANISTER SUCTION 2500CC (MISCELLANEOUS) ×1 IMPLANT
COVER SURGICAL LIGHT HANDLE (MISCELLANEOUS) ×2 IMPLANT
DECANTER SPIKE VIAL GLASS SM (MISCELLANEOUS) ×2 IMPLANT
DERMABOND ADVANCED (GAUZE/BANDAGES/DRESSINGS) ×1
DERMABOND ADVANCED .7 DNX12 (GAUZE/BANDAGES/DRESSINGS) ×1 IMPLANT
DEVICE SECURE STRAP 25 ABSORB (INSTRUMENTS) IMPLANT
DISSECT BALLN SPACEMKR + OVL (BALLOONS) ×2
DISSECTOR BALLN SPACEMKR + OVL (BALLOONS) ×1 IMPLANT
DISSECTOR BLUNT TIP ENDO 5MM (MISCELLANEOUS) IMPLANT
DRAPE UTILITY 15X26 W/TAPE STR (DRAPE) ×4 IMPLANT
ELECT REM PT RETURN 9FT ADLT (ELECTROSURGICAL) ×2
ELECTRODE REM PT RTRN 9FT ADLT (ELECTROSURGICAL) ×1 IMPLANT
GLOVE BIOGEL PI IND STRL 7.0 (GLOVE) IMPLANT
GLOVE BIOGEL PI INDICATOR 7.0 (GLOVE) ×3
GLOVE EUDERMIC 7 POWDERFREE (GLOVE) ×2 IMPLANT
GLOVE SS BIOGEL STRL SZ 6.5 (GLOVE) IMPLANT
GLOVE SUPERSENSE BIOGEL SZ 6.5 (GLOVE) ×1
GLOVE SURG SS PI 6.5 STRL IVOR (GLOVE) ×1 IMPLANT
GLOVE SURG SS PI 7.0 STRL IVOR (GLOVE) ×1 IMPLANT
GOWN STRL REUS W/ TWL LRG LVL3 (GOWN DISPOSABLE) ×2 IMPLANT
GOWN STRL REUS W/ TWL XL LVL3 (GOWN DISPOSABLE) ×1 IMPLANT
GOWN STRL REUS W/TWL LRG LVL3 (GOWN DISPOSABLE) ×4
GOWN STRL REUS W/TWL XL LVL3 (GOWN DISPOSABLE) ×2
KIT BASIN OR (CUSTOM PROCEDURE TRAY) ×2 IMPLANT
KIT ROOM TURNOVER OR (KITS) ×2 IMPLANT
MESH 3DMAX LIGHT 4.1X6.2 LT LR (Mesh General) ×1 IMPLANT
NDL INSUFFLATION 14GA 120MM (NEEDLE) ×1 IMPLANT
NEEDLE INSUFFLATION 14GA 120MM (NEEDLE) ×2 IMPLANT
NS IRRIG 1000ML POUR BTL (IV SOLUTION) ×2 IMPLANT
PAD ARMBOARD 7.5X6 YLW CONV (MISCELLANEOUS) ×4 IMPLANT
SET IRRIG TUBING LAPAROSCOPIC (IRRIGATION / IRRIGATOR) ×1 IMPLANT
SLEEVE ENDOPATH XCEL 5M (ENDOMECHANICALS) ×2 IMPLANT
SOL PREP POV-IOD 4OZ 10% (MISCELLANEOUS) ×1 IMPLANT
SPONGE GAUZE 4X4 12PLY (GAUZE/BANDAGES/DRESSINGS) ×1 IMPLANT
STOPCOCK 4 WAY LG BORE MALE ST (IV SETS) ×1 IMPLANT
SUT MNCRL AB 4-0 PS2 18 (SUTURE) ×2 IMPLANT
TACKER 5MM HERNIA 3.5CML NAB (ENDOMECHANICALS) ×1 IMPLANT
TOWEL OR 17X24 6PK STRL BLUE (TOWEL DISPOSABLE) ×2 IMPLANT
TOWEL OR 17X26 10 PK STRL BLUE (TOWEL DISPOSABLE) ×2 IMPLANT
TRAY FOLEY CATH 16FR SILVER (SET/KITS/TRAYS/PACK) ×2 IMPLANT
TRAY LAPAROSCOPIC (CUSTOM PROCEDURE TRAY) ×2 IMPLANT
TROCAR XCEL NON-BLD 5MMX100MML (ENDOMECHANICALS) ×2 IMPLANT

## 2014-03-29 NOTE — Discharge Instructions (Signed)
[facility] _______[facility] ° °UMBILICAL OR INGUINAL HERNIA REPAIR: POST OP INSTRUCTIONS ° °Always review your discharge instruction sheet given to you by the facility where your surgery was performed. °IF YOU HAVE DISABILITY OR FAMILY LEAVE FORMS, YOU MUST BRING THEM TO THE OFFICE FOR PROCESSING.   °DO NOT GIVE THEM TO YOUR DOCTOR. ° °1. A  prescription for pain medication may be given to you upon discharge.  Take your pain medication as prescribed, if needed.  If narcotic pain medicine is not needed, then you may take acetaminophen (Tylenol) or ibuprofen (Advil) as needed. °2. Take your usually prescribed medications unless otherwise directed. °3. If you need a refill on your pain medication, please contact your pharmacy.  They will contact our office to request authorization. Prescriptions will not be filled after 5 pm or on week-ends. °4. You should follow a light diet the first 24 hours after arrival home, such as soup and crackers, etc.  Be sure to include lots of fluids daily.  Resume your normal diet the day after surgery. °5. Most patients will experience some swelling and bruising around the umbilicus or in the groin and scrotum.  Ice packs and reclining will help.  Swelling and bruising can take several days to resolve.  °6. It is common to experience some constipation if taking pain medication after surgery.  Increasing fluid intake and taking a stool softener (such as Colace) will usually help or prevent this problem from occurring.  A mild laxative (Milk of Magnesia or Miralax) should be taken according to package directions if there are no bowel movements after 48 hours. °7. Unless discharge instructions indicate otherwise, you may remove your bandages 24-48 hours after surgery, and you may shower at that time.  You may have steri-strips (small skin tapes) in place directly over the incision.  These strips should be left on the skin for 7-10 days.  If your surgeon used skin glue on the  incision, you may shower in 24 hours.  The glue will flake off over the next 2-3 weeks.  Any sutures or staples will be removed at the office during your follow-up visit. °8. ACTIVITIES:  You may resume regular (light) daily activities beginning the next day--such as daily self-care, walking, climbing stairs--gradually increasing activities as tolerated.  You may have sexual intercourse when it is comfortable.  Refrain from any heavy lifting or straining until approved by your doctor. °a. You may drive when you are no longer taking prescription pain medication, you can comfortably wear a seatbelt, and you can safely maneuver your car and apply brakes. °b. RETURN TO WORK:  __________________________________________________________ °9. You should see your doctor in the office for a follow-up appointment approximately 2-3 weeks after your surgery.  Make sure that you call for this appointment within a day or two after you arrive home to insure a convenient appointment time. °10. OTHER INSTRUCTIONS:  __________________________________________________________________________________________________________________________________________________________________________________________  °WHEN TO CALL YOUR DOCTOR: °1. Fever over 101.0 °2. Inability to urinate °3. Nausea and/or vomiting °4. Extreme swelling or bruising °5. Continued bleeding from incision. °6. Increased pain, redness, or drainage from the incision ° °The clinic staff is available to answer your questions during regular business hours.  Please don’t hesitate to call and ask to speak to one of the nurses for clinical concerns.  If you have a medical emergency, go to the nearest emergency room or call 911.  A surgeon from [facility] is always on call at the hospital ° ° °  [address], [address], [place], [place]  [address] ? ° [address] [address], [place], [place]   [address] °[phone] ? [phone] ? FAX [phone] °Web site:  [email or web address] ° °What to eat: ° °For your first meals, you should eat lightly; only small meals initially.  If you do not have nausea, you may eat larger meals.  Avoid spicy, greasy and heavy food.   ° °General Anesthesia, Adult, Care After  °Refer to this sheet in the next few weeks. These instructions provide you with information on caring for yourself after your procedure. Your health care provider may also give you more specific instructions. Your treatment has been planned according to current medical practices, but problems sometimes occur. Call your health care provider if you have any problems or questions after your procedure.  °WHAT TO EXPECT AFTER THE PROCEDURE  °After the procedure, it is typical to experience:  °Sleepiness.  °Nausea and vomiting. °HOME CARE INSTRUCTIONS  °For the first 24 hours after general anesthesia:  °Have a responsible person with you.  °Do not drive a car. If you are alone, do not take public transportation.  °Do not drink alcohol.  °Do not take medicine that has not been prescribed by your health care provider.  °Do not sign important papers or make important decisions.  °You may resume a normal diet and activities as directed by your health care provider.  °Change bandages (dressings) as directed.  °If you have questions or problems that seem related to general anesthesia, call the hospital and ask for the anesthetist or anesthesiologist on call. °SEEK MEDICAL CARE IF:  °You have nausea and vomiting that continue the day after anesthesia.  °You develop a rash. °SEEK IMMEDIATE MEDICAL CARE IF:  °You have difficulty breathing.  °You have chest pain.  °You have any allergic problems. °Document Released: 03/18/2001 Document Revised: 08/12/2013 Document Reviewed: 06/25/2013  °ExitCare® Patient Information ©2014 [employer].  ° ° °

## 2014-03-29 NOTE — Op Note (Signed)
Patient Name:           Andrew Burch   Date of Surgery:        03/29/2014  Pre op Diagnosis:      Left inguinal hernia  Post op Diagnosis:    Direct left inguinal hernia  Procedure:                 Laparoscopic, preperitoneal repair of left inguinal hernia with mesh  Surgeon:                     Edsel Petrin. Dalbert Batman, M.D., FACS  Assistant:                      RN  Operative Indications:   Andrew Burch is a 80 y.o. male.  I evaluated this gentleman in September of 2011. He had a left inguinal hernia but really wasn't hurting him. He plays golf 4 times a week. He wanted to go ahead and have this repaired but then decided against it. The hernia has gotten much bigger but now it hurts him more. His health is otherwise been stable. Examination reveals a small to medium sized left inguinal hernia. This does not extend into the scrotum. There is no scrotal mass. I do not feel a hernia on the right side. Comorbidities include past history of TIA, hypertension, hyperlipidemia, GERD.  He takes Plavix and aspirin daily, Which had been held for 5 days.  He is allergic to penicillin which caused him to have a syncopal episode as a child.    Operative Findings:       Medium-sized direct left inguinal hernia was found. The peritoneum on the left side was riding up over the cord structures but did not extended to the internal repeat. The peritoneum associated with the cord was pulled back above the level of the anterior superior iliac spine. I saw no other abnormalities. The hernia was repaired with a 3D-Max light mesh, large size.  Procedure in Detail:          Following the induction of general endotracheal anesthesia the patient's bladder was emptied with a  Foley catheter. The abdomen and genitalia were prepped and draped in a sterile fashion. Intravenous antibiotics were given. Surgical time out was performed. 0.5% Marcaine with epinephrine was used as local infiltration anesthetic. A transverse incision was  made at the lower rim of the umbilicus. The fascia was incised transversely exposing the medial border of the left rectus muscle. The Spacemaker balloon was inserted in the left rectus sheath in the midline down to just above the symphysis pubis. The camera was inserted and the balloon was inflated manually until it deployed. It deployed somewhat more left than on the right. Ultimately I could see the posterior bellies the rectus muscle, inferior epigastric vessels, and the symphysis pubis. The balloon was held in place for about 5 minutes. The balloon was then deflated & removed. The trocar balloon was then inflated and secured and connected to the insufflator at 14 mm of mercury. The camera was inserted. Good visualization. It was a little bit of blood  Under the right and the left Cooper's ligament and was this was evacuated & there was no active bleeding. A 5 mm trocar was placed in the midline below the umbilicus. I cleaned off the peritoneum on the right side, inserted a 5 mm trocar in the right abdomen at about the level of the anterior superior spine. I  Passed the trocar  through the muscle layers  above the edge of the peritoneum. I dissected out the left inguinal hernia. There was a lot of incarcerated fat in the direct space. This was teased out and dissected and  completely evacuated. There was a moderately large sac which I pulled out, reduced and pulled up above and tacked to the posterior belly of the rectus muscle with the pro-tacker. I did this in hopes of reducing seroma formation. I then dissected the peritoneum off the lateral abdominal  wall to the left. I dissected the cord structures. There was no indirect hernia. I inserted the mesh and positioned it transversely so that it overlapped the midline slightly, overlapped  Cooper's ligament slightly. It deployed anteriorly and superiorly and laterally quite well. This was secured in place with about 10 firings of the pro-tacker. This provided  good coverage.  There was no bleeding. The pneumoperitoneum was released and the trocars were removed. The fascia at the umbilicus was closed with 2 figure-of-eight sutures of 0 Vicryl and the skin incisions closed with subcuticular stitch of 4-0 Monocryl and Dermabond. Patient tolerated procedure well and was taken to recovery in stable condition. EBL 15 cc. Counts correct. Complications none.     Edsel Petrin. Dalbert Batman, M.D., FACS General and Minimally Invasive Surgery Breast and Colorectal Surgery  03/29/2014 8:49 AM

## 2014-03-29 NOTE — Anesthesia Procedure Notes (Signed)
Procedure Name: Intubation Date/Time: 03/29/2014 7:33 AM Performed by: Jacob Moores Pre-anesthesia Checklist: Patient identified, Emergency Drugs available, Suction available and Patient being monitored Patient Re-evaluated:Patient Re-evaluated prior to inductionOxygen Delivery Method: Circle system utilized Preoxygenation: Pre-oxygenation with 100% oxygen Intubation Type: IV induction Ventilation: Mask ventilation without difficulty and Oral airway inserted - appropriate to patient size Laryngoscope Size: Sabra Heck and 2 Grade View: Grade I Tube type: Oral Tube size: 7.5 mm Number of attempts: 1 Airway Equipment and Method: Oral airway and Stylet Placement Confirmation: ETT inserted through vocal cords under direct vision,  positive ETCO2,  CO2 detector and breath sounds checked- equal and bilateral Secured at: 20 cm Tube secured with: Tape Dental Injury: Teeth and Oropharynx as per pre-operative assessment

## 2014-03-29 NOTE — Interval H&P Note (Signed)
History and Physical Interval Note:  03/29/2014 7:04 AM  Andrew Burch  has presented today for surgery, with the diagnosis of left inguinal hernia   The goals and the various methods of treatment have been discussed with the patient and family. After consideration of risks, benefits and other options for treatment, the patient has consented to  Procedure(s): LAPAROSCOPIC INGUINAL HERNIA (Left) INSERTION OF MESH (Left) ,  Possible open as a surgical intervention .  The patient's history has been reviewed, patient examined today, no change in status, stable for surgery.  I have reviewed the patient's chart and labs.  Questions were answered to the patient's satisfaction.     Adin Hector

## 2014-03-29 NOTE — Anesthesia Preprocedure Evaluation (Addendum)
Anesthesia Evaluation  Patient identified by MRN, date of birth, ID band Patient awake    Reviewed: Allergy & Precautions, H&P , NPO status , Patient's Chart, lab work & pertinent test results  Airway Mallampati: I TM Distance: >3 FB Neck ROM: Full    Dental  (+) Edentulous Upper, Teeth Intact, Dental Advisory Given   Pulmonary former smoker,  breath sounds clear to auscultation        Cardiovascular hypertension, Pt. on medications Rhythm:Regular Rate:Normal     Neuro/Psych CVA, No Residual Symptoms    GI/Hepatic GERD-  Medicated and Controlled,  Endo/Other  Hypothyroidism   Renal/GU      Musculoskeletal   Abdominal   Peds  Hematology   Anesthesia Other Findings   Reproductive/Obstetrics                         Anesthesia Physical Anesthesia Plan  ASA: III  Anesthesia Plan: General   Post-op Pain Management:    Induction: Intravenous  Airway Management Planned: Oral ETT  Additional Equipment:   Intra-op Plan:   Post-operative Plan: Extubation in OR  Informed Consent: I have reviewed the patients History and Physical, chart, labs and discussed the procedure including the risks, benefits and alternatives for the proposed anesthesia with the patient or authorized representative who has indicated his/her understanding and acceptance.   Dental advisory given  Plan Discussed with: Surgeon, CRNA and Anesthesiologist  Anesthesia Plan Comments:        Anesthesia Quick Evaluation

## 2014-03-29 NOTE — Anesthesia Postprocedure Evaluation (Signed)
  Anesthesia Post-op Note  Patient: [place]  Procedure(s) Performed: Procedure(s): LAPAROSCOPIC INGUINAL HERNIA (Left) INSERTION OF MESH (Left)  Patient Location: PACU  Anesthesia Type:General  Level of Consciousness: awake and alert   Airway and Oxygen Therapy: Patient Spontanous Breathing  Post-op Pain: mild  Post-op Assessment: Post-op Vital signs reviewed  Post-op Vital Signs: stable  Complications: No apparent anesthesia complications

## 2014-03-29 NOTE — Transfer of Care (Signed)
Immediate Anesthesia Transfer of Care Note  Patient: Andrew Burch  Procedure(s) Performed: Procedure(s): LAPAROSCOPIC INGUINAL HERNIA (Left) INSERTION OF MESH (Left)  Patient Location: PACU  Anesthesia Type:General  Level of Consciousness: awake and alert   Airway & Oxygen Therapy: Patient Spontanous Breathing and Patient connected to nasal cannula oxygen  Post-op Assessment: Report given to PACU RN, Post -op Vital signs reviewed and stable and Patient moving all extremities X 4  Post vital signs: Reviewed and stable  Complications: No apparent anesthesia complications

## 2014-03-31 ENCOUNTER — Encounter (HOSPITAL_COMMUNITY): Payer: Self-pay | Admitting: General Surgery

## 2014-04-27 ENCOUNTER — Ambulatory Visit (INDEPENDENT_AMBULATORY_CARE_PROVIDER_SITE_OTHER): Payer: Medicare Other | Admitting: General Surgery

## 2014-04-27 ENCOUNTER — Encounter (INDEPENDENT_AMBULATORY_CARE_PROVIDER_SITE_OTHER): Payer: Self-pay | Admitting: General Surgery

## 2014-04-27 VITALS — BP 144/82 | HR 60 | Temp 97.6°F | Resp 16 | Ht 66.0 in | Wt 174.6 lb

## 2014-04-27 DIAGNOSIS — K4090 Unilateral inguinal hernia, without obstruction or gangrene, not specified as recurrent: Secondary | ICD-10-CM

## 2014-04-27 NOTE — Progress Notes (Signed)
Patient ID: Andrew Burch, male   DOB: 1933-11-21, 80 y.o.   MRN: [id number] History: This patient underwent laparoscopic preperitoneal repair of his left inguinal hernia with mesh on 03/29/2014. He has done very well. He has no pain or wound problems  Exam: Patient looks well. Abdomen soft and nontender. Umbilical incision well-healed. Left inguinal area soft and nontender. No seroma. Penis scrotum and testes normal. Chronic elevation of right testicle again noted  Assessment: Left inguinal hernia, recovering uneventfully following laparoscopic repair with mesh  Plan: I told him to gradually get back to regular activities, including golf. Stretch well before and after exercise. Return to see me as necessary.   Edsel Petrin. Dalbert Batman, M.D., [facility] General and Minimally invasive Surgery Breast and Colorectal Surgery Office:   [phone] Pager:   [phone]

## 2014-04-27 NOTE — Patient Instructions (Signed)
You are recovering from your laparoscopic left inguinal hernia repair without any obvious surgical complications. The repair appears to be intact.  you may resume normal physical activities  Be sure to stretch well before and after exercise  Return to see Dr. Dalbert Batman as necessary

## 2014-08-07 ENCOUNTER — Encounter: Payer: Self-pay | Admitting: *Deleted

## 2016-08-29 DIAGNOSIS — L821 Other seborrheic keratosis: Secondary | ICD-10-CM | POA: Diagnosis not present

## 2016-08-29 DIAGNOSIS — Z85828 Personal history of other malignant neoplasm of skin: Secondary | ICD-10-CM | POA: Diagnosis not present

## 2016-08-29 DIAGNOSIS — L738 Other specified follicular disorders: Secondary | ICD-10-CM | POA: Diagnosis not present

## 2016-10-17 DIAGNOSIS — F324 Major depressive disorder, single episode, in partial remission: Secondary | ICD-10-CM | POA: Diagnosis not present

## 2016-10-17 DIAGNOSIS — K219 Gastro-esophageal reflux disease without esophagitis: Secondary | ICD-10-CM | POA: Diagnosis not present

## 2016-10-17 DIAGNOSIS — Z23 Encounter for immunization: Secondary | ICD-10-CM | POA: Diagnosis not present

## 2016-10-17 DIAGNOSIS — E039 Hypothyroidism, unspecified: Secondary | ICD-10-CM | POA: Diagnosis not present

## 2016-10-17 DIAGNOSIS — I10 Essential (primary) hypertension: Secondary | ICD-10-CM | POA: Diagnosis not present

## 2016-10-17 DIAGNOSIS — E782 Mixed hyperlipidemia: Secondary | ICD-10-CM | POA: Diagnosis not present

## 2016-10-17 DIAGNOSIS — I679 Cerebrovascular disease, unspecified: Secondary | ICD-10-CM | POA: Diagnosis not present

## 2016-10-17 DIAGNOSIS — Z0000 Encounter for general adult medical examination without abnormal findings: Secondary | ICD-10-CM | POA: Diagnosis not present

## 2016-11-01 DIAGNOSIS — H5203 Hypermetropia, bilateral: Secondary | ICD-10-CM | POA: Diagnosis not present

## 2017-02-21 DIAGNOSIS — C44519 Basal cell carcinoma of skin of other part of trunk: Secondary | ICD-10-CM | POA: Diagnosis not present

## 2017-02-21 DIAGNOSIS — L821 Other seborrheic keratosis: Secondary | ICD-10-CM | POA: Diagnosis not present

## 2017-02-21 DIAGNOSIS — D485 Neoplasm of uncertain behavior of skin: Secondary | ICD-10-CM | POA: Diagnosis not present

## 2017-02-21 DIAGNOSIS — Z85828 Personal history of other malignant neoplasm of skin: Secondary | ICD-10-CM | POA: Diagnosis not present

## 2017-02-21 DIAGNOSIS — L570 Actinic keratosis: Secondary | ICD-10-CM | POA: Diagnosis not present

## 2017-03-14 DIAGNOSIS — C44519 Basal cell carcinoma of skin of other part of trunk: Secondary | ICD-10-CM | POA: Diagnosis not present

## 2017-03-14 DIAGNOSIS — Z85828 Personal history of other malignant neoplasm of skin: Secondary | ICD-10-CM | POA: Diagnosis not present

## 2017-05-27 DIAGNOSIS — D485 Neoplasm of uncertain behavior of skin: Secondary | ICD-10-CM | POA: Diagnosis not present

## 2017-06-18 DIAGNOSIS — L720 Epidermal cyst: Secondary | ICD-10-CM | POA: Diagnosis not present

## 2017-06-18 DIAGNOSIS — Z85828 Personal history of other malignant neoplasm of skin: Secondary | ICD-10-CM | POA: Diagnosis not present

## 2017-06-27 DIAGNOSIS — L720 Epidermal cyst: Secondary | ICD-10-CM | POA: Diagnosis not present

## 2017-06-27 DIAGNOSIS — L988 Other specified disorders of the skin and subcutaneous tissue: Secondary | ICD-10-CM | POA: Diagnosis not present

## 2017-06-27 DIAGNOSIS — Z85828 Personal history of other malignant neoplasm of skin: Secondary | ICD-10-CM | POA: Diagnosis not present

## 2017-10-02 DIAGNOSIS — Z23 Encounter for immunization: Secondary | ICD-10-CM | POA: Diagnosis not present

## 2018-01-06 DIAGNOSIS — Z0000 Encounter for general adult medical examination without abnormal findings: Secondary | ICD-10-CM | POA: Diagnosis not present

## 2018-01-06 DIAGNOSIS — K219 Gastro-esophageal reflux disease without esophagitis: Secondary | ICD-10-CM | POA: Diagnosis not present

## 2018-01-06 DIAGNOSIS — F324 Major depressive disorder, single episode, in partial remission: Secondary | ICD-10-CM | POA: Diagnosis not present

## 2018-01-06 DIAGNOSIS — I10 Essential (primary) hypertension: Secondary | ICD-10-CM | POA: Diagnosis not present

## 2018-02-18 DIAGNOSIS — H52223 Regular astigmatism, bilateral: Secondary | ICD-10-CM | POA: Diagnosis not present

## 2018-02-27 DIAGNOSIS — L814 Other melanin hyperpigmentation: Secondary | ICD-10-CM | POA: Diagnosis not present

## 2018-02-27 DIAGNOSIS — L570 Actinic keratosis: Secondary | ICD-10-CM | POA: Diagnosis not present

## 2018-02-27 DIAGNOSIS — Z85828 Personal history of other malignant neoplasm of skin: Secondary | ICD-10-CM | POA: Diagnosis not present

## 2018-02-27 DIAGNOSIS — D1801 Hemangioma of skin and subcutaneous tissue: Secondary | ICD-10-CM | POA: Diagnosis not present

## 2018-02-27 DIAGNOSIS — D2239 Melanocytic nevi of other parts of face: Secondary | ICD-10-CM | POA: Diagnosis not present

## 2018-10-07 DIAGNOSIS — Z23 Encounter for immunization: Secondary | ICD-10-CM | POA: Diagnosis not present

## 2019-02-19 DIAGNOSIS — Z0000 Encounter for general adult medical examination without abnormal findings: Secondary | ICD-10-CM | POA: Diagnosis not present

## 2019-02-19 DIAGNOSIS — E039 Hypothyroidism, unspecified: Secondary | ICD-10-CM | POA: Diagnosis not present

## 2019-02-19 DIAGNOSIS — I10 Essential (primary) hypertension: Secondary | ICD-10-CM | POA: Diagnosis not present

## 2019-02-19 DIAGNOSIS — E782 Mixed hyperlipidemia: Secondary | ICD-10-CM | POA: Diagnosis not present

## 2019-02-19 DIAGNOSIS — Z23 Encounter for immunization: Secondary | ICD-10-CM | POA: Diagnosis not present

## 2019-03-05 DIAGNOSIS — L853 Xerosis cutis: Secondary | ICD-10-CM | POA: Diagnosis not present

## 2019-03-05 DIAGNOSIS — Z85828 Personal history of other malignant neoplasm of skin: Secondary | ICD-10-CM | POA: Diagnosis not present

## 2019-03-05 DIAGNOSIS — C44321 Squamous cell carcinoma of skin of nose: Secondary | ICD-10-CM | POA: Diagnosis not present

## 2019-03-05 DIAGNOSIS — D485 Neoplasm of uncertain behavior of skin: Secondary | ICD-10-CM | POA: Diagnosis not present

## 2019-03-05 DIAGNOSIS — L814 Other melanin hyperpigmentation: Secondary | ICD-10-CM | POA: Diagnosis not present

## 2019-03-30 DIAGNOSIS — C44321 Squamous cell carcinoma of skin of nose: Secondary | ICD-10-CM | POA: Diagnosis not present

## 2019-03-30 DIAGNOSIS — Z85828 Personal history of other malignant neoplasm of skin: Secondary | ICD-10-CM | POA: Diagnosis not present

## 2019-09-21 DIAGNOSIS — H2513 Age-related nuclear cataract, bilateral: Secondary | ICD-10-CM | POA: Diagnosis not present

## 2019-09-24 DIAGNOSIS — L820 Inflamed seborrheic keratosis: Secondary | ICD-10-CM | POA: Diagnosis not present

## 2019-09-24 DIAGNOSIS — L570 Actinic keratosis: Secondary | ICD-10-CM | POA: Diagnosis not present

## 2019-09-24 DIAGNOSIS — Z85828 Personal history of other malignant neoplasm of skin: Secondary | ICD-10-CM | POA: Diagnosis not present

## 2019-10-13 DIAGNOSIS — Z23 Encounter for immunization: Secondary | ICD-10-CM | POA: Diagnosis not present

## 2020-01-02 ENCOUNTER — Ambulatory Visit: Payer: Self-pay

## 2020-01-03 ENCOUNTER — Ambulatory Visit: Payer: Medicare Other | Attending: Internal Medicine

## 2020-01-03 DIAGNOSIS — Z23 Encounter for immunization: Secondary | ICD-10-CM

## 2020-01-03 NOTE — Progress Notes (Signed)
   Covid-19 Vaccination Clinic  Name:  ERVIN BHARDWAJ    MRN: [id number] DOB: December 08, 1933  01/03/2020  Mr. Dorfman was observed post Covid-19 immunization for 30 minutes based on pre-vaccination screening without incidence. He was provided with Vaccine Information Sheet and instruction to access the V-Safe system.   Mr. Heidelberg was instructed to call 911 with any severe reactions post vaccine: Marland Kitchen Difficulty breathing  . Swelling of your face and throat  . A fast heartbeat  . A bad rash all over your body  . Dizziness and weakness    Immunizations Administered    Name Date Dose VIS Date Route   Pfizer COVID-19 Vaccine 01/03/2020 11:15 AM 0.3 mL 12/04/2019 Intramuscular   Manufacturer: [employer]   Lot: [id number]   [place]: [id number]

## 2020-01-20 DIAGNOSIS — H25811 Combined forms of age-related cataract, right eye: Secondary | ICD-10-CM | POA: Diagnosis not present

## 2020-01-20 DIAGNOSIS — H2511 Age-related nuclear cataract, right eye: Secondary | ICD-10-CM | POA: Diagnosis not present

## 2020-01-23 ENCOUNTER — Ambulatory Visit: Payer: Medicare Other | Attending: Internal Medicine

## 2020-01-23 DIAGNOSIS — Z23 Encounter for immunization: Secondary | ICD-10-CM | POA: Insufficient documentation

## 2020-01-23 NOTE — Progress Notes (Signed)
   Covid-19 Vaccination Clinic  Name:  Andrew Burch    MRN: [id number] DOB: 28-Aug-1933  01/23/2020  [name] was observed post Covid-19 immunization for 15 minutes without incidence. He was provided with Vaccine Information Sheet and instruction to access the V-Safe system.   [name] was instructed to call 911 with any severe reactions post vaccine: Marland Kitchen Difficulty breathing  . Swelling of your face and throat  . A fast heartbeat  . A bad rash all over your body  . Dizziness and weakness    Immunizations Administered    Name Date Dose VIS Date Route   Pfizer COVID-19 Vaccine 01/23/2020 11:29 AM 0.3 mL 12/04/2019 Intramuscular   Manufacturer: [place]   Lot: [id number]   [place]: [id number]

## 2020-03-02 DIAGNOSIS — H2512 Age-related nuclear cataract, left eye: Secondary | ICD-10-CM | POA: Diagnosis not present

## 2020-03-02 DIAGNOSIS — H25812 Combined forms of age-related cataract, left eye: Secondary | ICD-10-CM | POA: Diagnosis not present

## 2020-04-06 DIAGNOSIS — I10 Essential (primary) hypertension: Secondary | ICD-10-CM | POA: Diagnosis not present

## 2020-04-06 DIAGNOSIS — E039 Hypothyroidism, unspecified: Secondary | ICD-10-CM | POA: Diagnosis not present

## 2020-04-06 DIAGNOSIS — E782 Mixed hyperlipidemia: Secondary | ICD-10-CM | POA: Diagnosis not present

## 2020-04-07 DIAGNOSIS — Z85828 Personal history of other malignant neoplasm of skin: Secondary | ICD-10-CM | POA: Diagnosis not present

## 2020-04-07 DIAGNOSIS — L821 Other seborrheic keratosis: Secondary | ICD-10-CM | POA: Diagnosis not present

## 2020-04-07 DIAGNOSIS — L820 Inflamed seborrheic keratosis: Secondary | ICD-10-CM | POA: Diagnosis not present

## 2020-04-07 DIAGNOSIS — L570 Actinic keratosis: Secondary | ICD-10-CM | POA: Diagnosis not present

## 2020-04-07 DIAGNOSIS — D0439 Carcinoma in situ of skin of other parts of face: Secondary | ICD-10-CM | POA: Diagnosis not present

## 2020-04-12 DIAGNOSIS — Z0000 Encounter for general adult medical examination without abnormal findings: Secondary | ICD-10-CM | POA: Diagnosis not present

## 2020-04-12 DIAGNOSIS — E782 Mixed hyperlipidemia: Secondary | ICD-10-CM | POA: Diagnosis not present

## 2020-04-12 DIAGNOSIS — I10 Essential (primary) hypertension: Secondary | ICD-10-CM | POA: Diagnosis not present

## 2020-04-12 DIAGNOSIS — E039 Hypothyroidism, unspecified: Secondary | ICD-10-CM | POA: Diagnosis not present

## 2020-05-05 DIAGNOSIS — D0439 Carcinoma in situ of skin of other parts of face: Secondary | ICD-10-CM | POA: Diagnosis not present

## 2020-10-11 ENCOUNTER — Other Ambulatory Visit (HOSPITAL_BASED_OUTPATIENT_CLINIC_OR_DEPARTMENT_OTHER): Payer: Self-pay | Admitting: Internal Medicine

## 2020-10-11 ENCOUNTER — Ambulatory Visit: Payer: Medicare Other | Attending: Internal Medicine

## 2020-10-11 DIAGNOSIS — Z23 Encounter for immunization: Secondary | ICD-10-CM

## 2020-10-11 NOTE — Progress Notes (Signed)
   Covid-19 Vaccination Clinic  Name:  BERDELL HOSTETLER    MRN: [id number] DOB: 11-29-1933  10/11/2020  Mr. Wilden was observed post Covid-19 immunization for 15 minutes without incident. He was provided with Vaccine Information Sheet and instruction to access the V-Safe system.   Mr. Blackwell was instructed to call 911 with any severe reactions post vaccine: Marland Kitchen Difficulty breathing  . Swelling of face and throat  . A fast heartbeat  . A bad rash all over body  . Dizziness and weakness

## 2020-10-13 DIAGNOSIS — Z961 Presence of intraocular lens: Secondary | ICD-10-CM | POA: Diagnosis not present

## 2020-10-18 MED FILL — PFIZER-BIONTECH COVID-19 VA: 30 | 1 days supply | Qty: 0 | Fill #0

## 2021-04-20 DIAGNOSIS — E039 Hypothyroidism, unspecified: Secondary | ICD-10-CM | POA: Diagnosis not present

## 2021-04-20 DIAGNOSIS — Z0000 Encounter for general adult medical examination without abnormal findings: Secondary | ICD-10-CM | POA: Diagnosis not present

## 2021-04-20 DIAGNOSIS — I10 Essential (primary) hypertension: Secondary | ICD-10-CM | POA: Diagnosis not present

## 2021-04-20 DIAGNOSIS — E782 Mixed hyperlipidemia: Secondary | ICD-10-CM | POA: Diagnosis not present

## 2021-06-01 DIAGNOSIS — L814 Other melanin hyperpigmentation: Secondary | ICD-10-CM | POA: Diagnosis not present

## 2021-06-01 DIAGNOSIS — L570 Actinic keratosis: Secondary | ICD-10-CM | POA: Diagnosis not present

## 2021-06-01 DIAGNOSIS — Z85828 Personal history of other malignant neoplasm of skin: Secondary | ICD-10-CM | POA: Diagnosis not present

## 2021-06-01 DIAGNOSIS — L905 Scar conditions and fibrosis of skin: Secondary | ICD-10-CM | POA: Diagnosis not present

## 2021-09-13 ENCOUNTER — Ambulatory Visit: Payer: Medicare Other | Attending: Internal Medicine

## 2021-09-13 DIAGNOSIS — Z23 Encounter for immunization: Secondary | ICD-10-CM

## 2021-09-13 NOTE — Progress Notes (Signed)
   Covid-19 Vaccination Clinic  Name:  DEIONDRE HARROWER    MRN: [id number] DOB: [birth year]  09/13/2021  Mr. Stepanek was observed post Covid-19 immunization for 15 minutes without incident. He was provided with Vaccine Information Sheet and instruction to access the V-Safe system.   Mr. Pautz was instructed to call 911 with any severe reactions post vaccine: Difficulty breathing  Swelling of face and throat  A fast heartbeat  A bad rash all over body  Dizziness and weakness

## 2021-09-19 ENCOUNTER — Other Ambulatory Visit (HOSPITAL_BASED_OUTPATIENT_CLINIC_OR_DEPARTMENT_OTHER): Payer: Self-pay

## 2021-09-19 MED ORDER — COVID-19MRNA BIVAL VACC PFIZER 30 MCG/0.3ML IM SUSP
INTRAMUSCULAR | 0 refills | Status: AC
  Filled 2021-09-19: qty 0.3, 1d supply, fill #0

## 2022-02-08 DIAGNOSIS — L821 Other seborrheic keratosis: Secondary | ICD-10-CM | POA: Diagnosis not present

## 2022-02-08 DIAGNOSIS — Z85828 Personal history of other malignant neoplasm of skin: Secondary | ICD-10-CM | POA: Diagnosis not present

## 2022-03-01 DIAGNOSIS — H5201 Hypermetropia, right eye: Secondary | ICD-10-CM | POA: Diagnosis not present

## 2022-04-26 DIAGNOSIS — I10 Essential (primary) hypertension: Secondary | ICD-10-CM | POA: Diagnosis not present

## 2022-04-26 DIAGNOSIS — E039 Hypothyroidism, unspecified: Secondary | ICD-10-CM | POA: Diagnosis not present

## 2022-04-26 DIAGNOSIS — E782 Mixed hyperlipidemia: Secondary | ICD-10-CM | POA: Diagnosis not present

## 2022-04-30 DIAGNOSIS — E039 Hypothyroidism, unspecified: Secondary | ICD-10-CM | POA: Diagnosis not present

## 2022-04-30 DIAGNOSIS — I10 Essential (primary) hypertension: Secondary | ICD-10-CM | POA: Diagnosis not present

## 2022-04-30 DIAGNOSIS — K219 Gastro-esophageal reflux disease without esophagitis: Secondary | ICD-10-CM | POA: Diagnosis not present

## 2022-04-30 DIAGNOSIS — Z0000 Encounter for general adult medical examination without abnormal findings: Secondary | ICD-10-CM | POA: Diagnosis not present

## 2022-06-21 DIAGNOSIS — Z85828 Personal history of other malignant neoplasm of skin: Secondary | ICD-10-CM | POA: Diagnosis not present

## 2022-06-21 DIAGNOSIS — L814 Other melanin hyperpigmentation: Secondary | ICD-10-CM | POA: Diagnosis not present

## 2022-06-21 DIAGNOSIS — L820 Inflamed seborrheic keratosis: Secondary | ICD-10-CM | POA: Diagnosis not present

## 2022-06-21 DIAGNOSIS — L570 Actinic keratosis: Secondary | ICD-10-CM | POA: Diagnosis not present

## 2022-10-09 ENCOUNTER — Other Ambulatory Visit (HOSPITAL_BASED_OUTPATIENT_CLINIC_OR_DEPARTMENT_OTHER): Payer: Self-pay

## 2022-10-09 MED ORDER — FLUAD QUADRIVALENT 0.5 ML IM PRSY
PREFILLED_SYRINGE | INTRAMUSCULAR | 0 refills | Status: AC
  Filled 2022-10-09: qty 0.5, 1d supply, fill #0

## 2022-10-09 MED ORDER — COMIRNATY 30 MCG/0.3ML IM SUSY
PREFILLED_SYRINGE | INTRAMUSCULAR | 0 refills | Status: AC
  Filled 2022-10-09: qty 0.3, 1d supply, fill #0

## 2022-12-28 DIAGNOSIS — F411 Generalized anxiety disorder: Secondary | ICD-10-CM | POA: Diagnosis not present

## 2022-12-28 DIAGNOSIS — F324 Major depressive disorder, single episode, in partial remission: Secondary | ICD-10-CM | POA: Diagnosis not present

## 2023-01-08 DIAGNOSIS — F411 Generalized anxiety disorder: Secondary | ICD-10-CM | POA: Diagnosis not present

## 2023-01-08 DIAGNOSIS — F324 Major depressive disorder, single episode, in partial remission: Secondary | ICD-10-CM | POA: Diagnosis not present

## 2023-02-28 DIAGNOSIS — L720 Epidermal cyst: Secondary | ICD-10-CM | POA: Diagnosis not present

## 2023-02-28 DIAGNOSIS — Z85828 Personal history of other malignant neoplasm of skin: Secondary | ICD-10-CM | POA: Diagnosis not present

## 2023-02-28 DIAGNOSIS — L570 Actinic keratosis: Secondary | ICD-10-CM | POA: Diagnosis not present

## 2023-02-28 DIAGNOSIS — L821 Other seborrheic keratosis: Secondary | ICD-10-CM | POA: Diagnosis not present

## 2023-05-02 DIAGNOSIS — I10 Essential (primary) hypertension: Secondary | ICD-10-CM | POA: Diagnosis not present

## 2023-05-02 DIAGNOSIS — E782 Mixed hyperlipidemia: Secondary | ICD-10-CM | POA: Diagnosis not present

## 2023-05-02 DIAGNOSIS — E039 Hypothyroidism, unspecified: Secondary | ICD-10-CM | POA: Diagnosis not present

## 2023-05-09 DIAGNOSIS — E039 Hypothyroidism, unspecified: Secondary | ICD-10-CM | POA: Diagnosis not present

## 2023-05-09 DIAGNOSIS — I10 Essential (primary) hypertension: Secondary | ICD-10-CM | POA: Diagnosis not present

## 2023-05-09 DIAGNOSIS — E782 Mixed hyperlipidemia: Secondary | ICD-10-CM | POA: Diagnosis not present

## 2023-05-09 DIAGNOSIS — F324 Major depressive disorder, single episode, in partial remission: Secondary | ICD-10-CM | POA: Diagnosis not present

## 2023-05-09 DIAGNOSIS — Z0000 Encounter for general adult medical examination without abnormal findings: Secondary | ICD-10-CM | POA: Diagnosis not present

## 2023-09-03 DIAGNOSIS — L570 Actinic keratosis: Secondary | ICD-10-CM | POA: Diagnosis not present

## 2023-09-03 DIAGNOSIS — L821 Other seborrheic keratosis: Secondary | ICD-10-CM | POA: Diagnosis not present

## 2023-09-03 DIAGNOSIS — L918 Other hypertrophic disorders of the skin: Secondary | ICD-10-CM | POA: Diagnosis not present

## 2023-09-03 DIAGNOSIS — Z85828 Personal history of other malignant neoplasm of skin: Secondary | ICD-10-CM | POA: Diagnosis not present

## 2023-09-03 DIAGNOSIS — L820 Inflamed seborrheic keratosis: Secondary | ICD-10-CM | POA: Diagnosis not present

## 2023-09-09 ENCOUNTER — Other Ambulatory Visit (HOSPITAL_BASED_OUTPATIENT_CLINIC_OR_DEPARTMENT_OTHER): Payer: Self-pay

## 2023-09-09 MED ORDER — COVID-19 MRNA VAC-TRIS(PFIZER) 30 MCG/0.3ML IM SUSY
0.3000 mL | PREFILLED_SYRINGE | Freq: Once | INTRAMUSCULAR | 0 refills | Status: AC
  Filled 2023-09-09: qty 0.3, 1d supply, fill #0

## 2023-09-09 MED ORDER — INFLUENZA VAC A&B SURF ANT ADJ 0.5 ML IM SUSY
0.5000 mL | PREFILLED_SYRINGE | Freq: Once | INTRAMUSCULAR | 0 refills | Status: AC
  Filled 2023-09-09: qty 0.5, 1d supply, fill #0

## 2023-09-17 DIAGNOSIS — E782 Mixed hyperlipidemia: Secondary | ICD-10-CM | POA: Diagnosis not present

## 2023-09-25 DIAGNOSIS — M47896 Other spondylosis, lumbar region: Secondary | ICD-10-CM | POA: Diagnosis not present

## 2023-09-25 DIAGNOSIS — M545 Low back pain, unspecified: Secondary | ICD-10-CM | POA: Insufficient documentation

## 2023-10-15 ENCOUNTER — Other Ambulatory Visit (HOSPITAL_BASED_OUTPATIENT_CLINIC_OR_DEPARTMENT_OTHER): Payer: Self-pay

## 2023-10-15 MED ORDER — RSVPREF3 VAC RECOMB ADJUVANTED 120 MCG/0.5ML IM SUSR
0.5000 mL | Freq: Once | INTRAMUSCULAR | 0 refills | Status: AC
  Filled 2023-10-15: qty 0.5, 1d supply, fill #0

## 2023-10-16 ENCOUNTER — Other Ambulatory Visit (HOSPITAL_BASED_OUTPATIENT_CLINIC_OR_DEPARTMENT_OTHER): Payer: Self-pay

## 2024-03-12 ENCOUNTER — Ambulatory Visit: Admission: EM | Admit: 2024-03-12 | Discharge: 2024-03-12 | Disposition: A | Discharge status: Home / Self Care

## 2024-03-12 ENCOUNTER — Ambulatory Visit: Admitting: Radiology

## 2024-03-12 ENCOUNTER — Encounter: Payer: Self-pay | Admitting: Emergency Medicine

## 2024-03-12 DIAGNOSIS — R0989 Other specified symptoms and signs involving the circulatory and respiratory systems: Secondary | ICD-10-CM

## 2024-03-12 DIAGNOSIS — R053 Chronic cough: Secondary | ICD-10-CM | POA: Diagnosis not present

## 2024-03-12 DIAGNOSIS — G9331 Postviral fatigue syndrome: Secondary | ICD-10-CM | POA: Diagnosis not present

## 2024-03-12 MED ORDER — AZELASTINE HCL 0.1 % NA SOLN: 1.0000 | Freq: Two times a day (BID) | NASAL | 1 refills | Status: AC

## 2024-03-12 NOTE — Discharge Instructions (Signed)
 1. Chest congestion - EKG 12-Lead performed in UC is normal, shows normal sinus rhythm, no STEMI. - DG Chest 2 View x-ray performed in UC shows no acute cardiopulmonary processes, no sign of pneumonia. -Radiologist read still pending, if radiologist opinion differs from mine and final read you will be contacted appropriate guidance provided.  2. Postviral syndrome (Primary) - azelastine (ASTELIN) 0.1 % nasal spray; Place 1 spray into both nostrils 2 (two) times daily. Use in each nostril as directed for nasal congestion and postnasal drip -Continue to monitor symptoms for change in severity if there is any escalation of current symptoms or development of new symptoms follow-up for further evaluation and management

## 2024-03-12 NOTE — ED Provider Notes (Signed)
 [facility]  Note:  This document was prepared using Dragon voice recognition software and may include unintentional dictation errors.  MRN: [id number] DOB: October 18, 1933  Subjective:   Andrew Burch is a 89 y.o. male presenting for cough, nasal congestion, chest congestion x 7 days.  Patient denies any known sick contacts.  States that he had a mild fever earlier in the week but has now subsided.  Patient states that occasionally during coughing fits that he has mild dizziness but quickly subsides after coughing.  Denies chest pain, shortness of breath, weakness, dizziness.  Patient states he has been taking over-the-counter cough syrup with mild improvement to symptoms.  Patient concerned that he may have developed a secondary pneumonia and would like chest x-ray wiling on the urgent care.  No current facility-administered medications for this encounter.  Current Outpatient Medications:    aspirin 81 MG tablet, Take 81 mg by mouth daily., Disp: , Rfl:    azelastine (ASTELIN) 0.1 % nasal spray, Place 1 spray into both nostrils 2 (two) times daily. Use in each nostril as directed, Disp: 30 mL, Rfl: 1   clopidogrel (PLAVIX) 75 MG tablet, Take 75 mg by mouth daily. , Disp: , Rfl:    COVID-19 mRNA bivalent vaccine, Pfizer, injection, Inject into the muscle., Disp: 0.3 mL, Rfl: 0   COVID-19 mRNA vaccine 2023-2024 (COMIRNATY) syringe, Inject into the muscle., Disp: 0.3 mL, Rfl: 0   fenofibrate 160 MG tablet, Take 160 mg by mouth daily. , Disp: , Rfl:    influenza vaccine adjuvanted (FLUAD QUADRIVALENT) 0.5 ML injection, Inject into the muscle., Disp: 0.5 mL, Rfl: 0   levothyroxine (SYNTHROID, LEVOTHROID) 88 MCG tablet, Take 88 mcg by mouth daily before breakfast. , Disp: , Rfl:    Multiple Vitamin (MULTIVITAMIN) capsule, Take 1 capsule by mouth daily., Disp: , Rfl:    Omega-3 Fatty Acids (FISH OIL) 1000 MG CAPS, Take 4,000 mg by mouth daily. , Disp: , Rfl:    PARoxetine (PAXIL) 20 MG  tablet, Take 20 mg by mouth daily. , Disp: , Rfl:    quinapril (ACCUPRIL) 20 MG tablet, Take 20 mg by mouth daily. , Disp: , Rfl:    ranitidine (ZANTAC) 300 MG capsule, Take 300 mg by mouth 2 (two) times daily. , Disp: , Rfl:    rosuvastatin (CRESTOR) 20 MG tablet, Take 20 mg by mouth daily., Disp: , Rfl:    Allergies  Allergen Reactions   Atorvastatin    Penicillins Rash    Past Medical History:  Diagnosis Date   Anxiety and depression    BPH (benign prostatic hypertrophy)    GERD (gastroesophageal reflux disease)    H/O exercise stress test 2000   H/O TIA (transient ischemic attack) and stroke 2005   Hiatal hernia    Hyperlipidemia    Hypertension    Skin cancer of nose    Stroke [facility])    Thyroid disease      Past Surgical History:  Procedure Laterality Date   COLONOSCOPY     INGUINAL HERNIA REPAIR Left 03/29/2014   Procedure: LAPAROSCOPIC INGUINAL HERNIA;  Surgeon: Ernestene Mention, MD;  Location: [facility] OR;  Service: General;  Laterality: Left;   INSERTION OF MESH Left 03/29/2014   Procedure: INSERTION OF MESH;  Surgeon: Ernestene Mention, MD;  Location: [facility] OR;  Service: General;  Laterality: Left;   NO PAST SURGERIES     NOSE SURGERY     local anesthesia    Family History  Problem Relation Age of Onset   Dementia Mother    Dementia Sister    Heart disease Brother    Breast cancer Sister     Social History   Tobacco Use   Smoking status: Former    Current packs/day: 1.00    Average packs/day: 1 pack/day for 10.0 years (10.0 ttl pk-yrs)    Types: Cigarettes  Substance Use Topics   Alcohol use: No   Drug use: No    ROS Refer to HPI for ROS details.  Objective:   Vitals: BP 121/71 (BP Location: Right Arm)   Pulse 73   Temp (!) 97.4 F (36.3 C) (Oral)   Resp 17   SpO2 97%   Physical Exam Vitals and nursing note reviewed.  Constitutional:      General: He is not in acute distress.    Appearance: Normal appearance. He is well-developed. He is not  ill-appearing or toxic-appearing.  HENT:     Head: Normocephalic.     Right Ear: Tympanic membrane, ear canal and external ear normal.     Left Ear: Tympanic membrane, ear canal and external ear normal.     Nose: Congestion and rhinorrhea present.     Mouth/Throat:     Mouth: Mucous membranes are moist.     Pharynx: Oropharynx is clear. No oropharyngeal exudate or posterior oropharyngeal erythema.  Eyes:     Extraocular Movements: Extraocular movements intact.     Conjunctiva/sclera: Conjunctivae normal.  Cardiovascular:     Rate and Rhythm: Normal rate and regular rhythm.     Heart sounds: No murmur heard. Pulmonary:     Effort: Pulmonary effort is normal. No respiratory distress.     Breath sounds: Normal breath sounds. No stridor. No wheezing, rhonchi or rales.  Skin:    General: Skin is warm and dry.  Neurological:     General: No focal deficit present.     Mental Status: He is alert and oriented to person, place, and time.  Psychiatric:        Mood and Affect: Mood normal.     Procedures  No results found for this or any previous visit (from the past 24 hours).  Assessment and Plan :   PDMP not reviewed this encounter.  1. Postviral syndrome   2. Chest congestion    1. Chest congestion - EKG 12-Lead performed in UC is normal, shows normal sinus rhythm, no STEMI. - DG Chest 2 View x-ray performed in UC shows no acute cardiopulmonary processes, no sign of pneumonia. -Radiologist read still pending, if radiologist opinion differs from mine and final read you will be contacted appropriate guidance provided.  2. Postviral syndrome (Primary) - azelastine (ASTELIN) 0.1 % nasal spray; Place 1 spray into both nostrils 2 (two) times daily. Use in each nostril as directed for nasal congestion and postnasal drip -Continue to monitor symptoms for change in severity if there is any escalation of current symptoms or development of new symptoms follow-up for further evaluation and  management  Tonny Bollman, Bertina Guthridge B, NP 03/12/24 1407

## 2024-03-12 NOTE — ED Triage Notes (Signed)
 Pt c/o congestion and cough for 1 week He sometimes feels dizzy when coughing

## 2024-04-02 DIAGNOSIS — L821 Other seborrheic keratosis: Secondary | ICD-10-CM | POA: Diagnosis not present

## 2024-04-02 DIAGNOSIS — L814 Other melanin hyperpigmentation: Secondary | ICD-10-CM | POA: Diagnosis not present

## 2024-04-02 DIAGNOSIS — L570 Actinic keratosis: Secondary | ICD-10-CM | POA: Diagnosis not present

## 2024-04-02 DIAGNOSIS — D1801 Hemangioma of skin and subcutaneous tissue: Secondary | ICD-10-CM | POA: Diagnosis not present

## 2024-04-02 DIAGNOSIS — Z85828 Personal history of other malignant neoplasm of skin: Secondary | ICD-10-CM | POA: Diagnosis not present

## 2024-05-14 DIAGNOSIS — E782 Mixed hyperlipidemia: Secondary | ICD-10-CM | POA: Diagnosis not present

## 2024-05-14 DIAGNOSIS — I10 Essential (primary) hypertension: Secondary | ICD-10-CM | POA: Diagnosis not present

## 2024-05-14 DIAGNOSIS — E039 Hypothyroidism, unspecified: Secondary | ICD-10-CM | POA: Diagnosis not present

## 2024-05-16 DIAGNOSIS — M5459 Other low back pain: Secondary | ICD-10-CM | POA: Diagnosis not present

## 2024-05-21 DIAGNOSIS — I679 Cerebrovascular disease, unspecified: Secondary | ICD-10-CM | POA: Diagnosis not present

## 2024-05-21 DIAGNOSIS — I10 Essential (primary) hypertension: Secondary | ICD-10-CM | POA: Diagnosis not present

## 2024-05-21 DIAGNOSIS — E782 Mixed hyperlipidemia: Secondary | ICD-10-CM | POA: Diagnosis not present

## 2024-05-21 DIAGNOSIS — F324 Major depressive disorder, single episode, in partial remission: Secondary | ICD-10-CM | POA: Diagnosis not present

## 2024-05-21 DIAGNOSIS — K219 Gastro-esophageal reflux disease without esophagitis: Secondary | ICD-10-CM | POA: Diagnosis not present

## 2024-05-21 DIAGNOSIS — Z6827 Body mass index (BMI) 27.0-27.9, adult: Secondary | ICD-10-CM | POA: Diagnosis not present

## 2024-05-21 DIAGNOSIS — E039 Hypothyroidism, unspecified: Secondary | ICD-10-CM | POA: Diagnosis not present

## 2024-05-21 DIAGNOSIS — F411 Generalized anxiety disorder: Secondary | ICD-10-CM | POA: Diagnosis not present

## 2024-05-21 DIAGNOSIS — Z0000 Encounter for general adult medical examination without abnormal findings: Secondary | ICD-10-CM | POA: Diagnosis not present

## 2024-09-03 DIAGNOSIS — Z961 Presence of intraocular lens: Secondary | ICD-10-CM | POA: Diagnosis not present

## 2024-09-03 DIAGNOSIS — H04123 Dry eye syndrome of bilateral lacrimal glands: Secondary | ICD-10-CM | POA: Diagnosis not present

## 2024-10-07 ENCOUNTER — Other Ambulatory Visit (HOSPITAL_BASED_OUTPATIENT_CLINIC_OR_DEPARTMENT_OTHER): Payer: Self-pay

## 2024-10-07 MED ORDER — FLUZONE HIGH-DOSE 0.5 ML IM SUSY
0.5000 mL | PREFILLED_SYRINGE | Freq: Once | INTRAMUSCULAR | 0 refills | Status: AC
  Filled 2024-10-07: qty 0.5, 1d supply, fill #0

## 2024-10-07 MED ORDER — COMIRNATY 30 MCG/0.3ML IM SUSY
0.3000 mL | PREFILLED_SYRINGE | Freq: Once | INTRAMUSCULAR | 0 refills | Status: AC
  Filled 2024-10-07: qty 0.3, 1d supply, fill #0

## 2024-10-15 DIAGNOSIS — Z85828 Personal history of other malignant neoplasm of skin: Secondary | ICD-10-CM | POA: Diagnosis not present

## 2024-10-15 DIAGNOSIS — C44729 Squamous cell carcinoma of skin of left lower limb, including hip: Secondary | ICD-10-CM | POA: Diagnosis not present

## 2024-11-26 DIAGNOSIS — R42 Dizziness and giddiness: Secondary | ICD-10-CM | POA: Diagnosis not present

## 2024-11-26 DIAGNOSIS — Z6827 Body mass index (BMI) 27.0-27.9, adult: Secondary | ICD-10-CM | POA: Diagnosis not present

## 2024-11-26 DIAGNOSIS — I10 Essential (primary) hypertension: Secondary | ICD-10-CM | POA: Diagnosis not present

## 2024-11-26 DIAGNOSIS — F419 Anxiety disorder, unspecified: Secondary | ICD-10-CM | POA: Diagnosis not present

## 2024-12-31 ENCOUNTER — Ambulatory Visit: Payer: Self-pay | Admitting: Podiatry

## 2024-12-31 ENCOUNTER — Encounter: Payer: Self-pay | Admitting: Podiatry

## 2024-12-31 DIAGNOSIS — D2372 Other benign neoplasm of skin of left lower limb, including hip: Secondary | ICD-10-CM | POA: Diagnosis not present

## 2024-12-31 DIAGNOSIS — M7752 Other enthesopathy of left foot: Secondary | ICD-10-CM | POA: Diagnosis not present

## 2024-12-31 MED ORDER — DEXAMETHASONE SODIUM PHOSPHATE 120 MG/30ML IJ SOLN
2.0000 mg | Freq: Once | INTRAMUSCULAR | Status: AC
  Administered 2024-12-31: 2 mg via INTRA_ARTICULAR

## 2024-12-31 NOTE — Progress Notes (Signed)
 "  Subjective:  Patient ID: Andrew Burch, male    DOB: 1933/10/03,  MRN: [id number] HPI Chief Complaint  Patient presents with   Foot Pain    Sub 5th MPJ left - small, callused area x years, usually tries to keep sanded down, sore sometimes   New Patient (Initial Visit)    89 y.o. male presents with the above complaint.   ROS: Denies fever chills nausea mobic muscle aches pains calf pain back pain chest pain shortness of breath.  Past Medical History:  Diagnosis Date   Anxiety and depression    BPH (benign prostatic hypertrophy)    GERD (gastroesophageal reflux disease)    H/O exercise stress test 2000   H/O TIA (transient ischemic attack) and stroke 2005   Hiatal hernia    Hyperlipidemia    Hypertension    Skin cancer of nose    Stroke [facility])    Thyroid  disease    Past Surgical History:  Procedure Laterality Date   COLONOSCOPY     INGUINAL HERNIA REPAIR Left 03/29/2014   Procedure: LAPAROSCOPIC INGUINAL HERNIA;  Surgeon: Elon CHRISTELLA Pacini, MD;  Location: [facility] OR;  Service: General;  Laterality: Left;   INSERTION OF MESH Left 03/29/2014   Procedure: INSERTION OF MESH;  Surgeon: Elon CHRISTELLA Pacini, MD;  Location: [facility] OR;  Service: General;  Laterality: Left;   NO PAST SURGERIES     NOSE SURGERY     local anesthesia   Current Medications[1]  Allergies[2] Review of Systems Objective:  There were no vitals filed for this visit.  General: Well developed, nourished, in no acute distress, alert and oriented x3   Dermatological: Skin is warm, dry and supple bilateral. Nails x 10 are well maintained; remaining integument appears unremarkable at this time. There are no open sores, no preulcerative lesions, no rash or signs of infection present.  Vascular: Dorsalis Pedis artery and Posterior Tibial artery pedal pulses are 2/4 bilateral with immedate capillary fill time. Pedal hair growth present. No varicosities and no lower extremity edema present bilateral.   Neruologic: Grossly  intact via light touch bilateral. Vibratory intact via tuning fork bilateral. Protective threshold with Semmes Wienstein monofilament intact to all pedal sites bilateral. Patellar and Achilles deep tendon reflexes 2+ bilateral. No Babinski or clonus noted bilateral.   Musculoskeletal: No gross boney pedal deformities bilateral. No pain, crepitus, or limitation noted with foot and ankle range of motion bilateral. Muscular strength 5/5 in all groups tested bilateral.  Mild hallux valgus deformity tailor bunion deformities bilateral.  He has an area of mild erythema to the plantar aspect of the fifth metatarsal head underlying a benign skin lesion.  This area of erythema does appear to be fluctuant on palpation.  Possibly indicating bursitis.  Gait: Unassisted, Nonantalgic.    Radiographs:  None taken  Assessment & Plan:   Assessment: Tailor's bunion deformity with bursitis and benign skin lesion left foot.  Plan: Injected the bursa today with 4 mg of dexamethasone  local anesthetic.  Tolerated procedure well after debridement he was completely void of pain.  Very happy with the outcome.  I did discuss recurrence with him he will follow-up with me with further symptomatology.     Kewana Sanon T. Aeriana Speece, DPM    [1]  Current Outpatient Medications:    irbesartan (AVAPRO) 150 MG tablet, SMARTSIG:1 Tablet(s) By Mouth, Disp: , Rfl:    levothyroxine (SYNTHROID) 75 MCG tablet, Take 75 mcg by mouth every morning., Disp: , Rfl:    LORazepam (  ATIVAN) 1 MG tablet, Take 1 mg by mouth 2 (two) times daily as needed., Disp: , Rfl:    meclizine (ANTIVERT) 25 MG tablet, SMARTSIG:1 Tablet(s) By Mouth Every 12 Hours PRN, Disp: , Rfl:    pantoprazole (PROTONIX) 40 MG tablet, SMARTSIG:1 Tablet(s) By Mouth, Disp: , Rfl:    aspirin 81 MG tablet, Take 81 mg by mouth daily., Disp: , Rfl:    azelastine  (ASTELIN ) 0.1 % nasal spray, Place 1 spray into both nostrils 2 (two) times daily. Use in each nostril as directed, Disp: 30  mL, Rfl: 1   clopidogrel (PLAVIX) 75 MG tablet, Take 75 mg by mouth daily. , Disp: , Rfl:    COVID-19 mRNA bivalent vaccine, Pfizer, injection, Inject into the muscle., Disp: 0.3 mL, Rfl: 0   COVID-19 mRNA vaccine 2023-2024 (COMIRNATY ) syringe, Inject into the muscle., Disp: 0.3 mL, Rfl: 0   fenofibrate 160 MG tablet, Take 160 mg by mouth daily. , Disp: , Rfl:    influenza vaccine adjuvanted (FLUAD  QUADRIVALENT) 0.5 ML injection, Inject into the muscle., Disp: 0.5 mL, Rfl: 0   Multiple Vitamin (MULTIVITAMIN) capsule, Take 1 capsule by mouth daily., Disp: , Rfl:    Omega-3 Fatty Acids (FISH OIL) 1000 MG CAPS, Take 4,000 mg by mouth daily. , Disp: , Rfl:    PARoxetine (PAXIL) 20 MG tablet, Take 20 mg by mouth daily. , Disp: , Rfl:    quinapril (ACCUPRIL) 20 MG tablet, Take 20 mg by mouth daily. , Disp: , Rfl:    ranitidine (ZANTAC) 300 MG capsule, Take 300 mg by mouth 2 (two) times daily. , Disp: , Rfl:    rosuvastatin (CRESTOR) 20 MG tablet, Take 20 mg by mouth daily., Disp: , Rfl:  [2]  Allergies Allergen Reactions   Atorvastatin    Penicillins Rash   "
